# Patient Record
Sex: Male | Born: 1950 | Race: White | Hispanic: No | Marital: Married | Smoking: Never smoker
Health system: Southern US, Community
[De-identification: ages and names within clinical notes are randomized; demographics above are authoritative.]

## PROBLEM LIST (undated history)

## (undated) DIAGNOSIS — E669 Obesity, unspecified: Secondary | ICD-10-CM

## (undated) DIAGNOSIS — J189 Pneumonia, unspecified organism: Secondary | ICD-10-CM

## (undated) DIAGNOSIS — R51 Headache: Secondary | ICD-10-CM

## (undated) DIAGNOSIS — I10 Essential (primary) hypertension: Secondary | ICD-10-CM

## (undated) DIAGNOSIS — Z9989 Dependence on other enabling machines and devices: Secondary | ICD-10-CM

## (undated) DIAGNOSIS — N200 Calculus of kidney: Secondary | ICD-10-CM

## (undated) DIAGNOSIS — R519 Headache, unspecified: Secondary | ICD-10-CM

## (undated) DIAGNOSIS — K759 Inflammatory liver disease, unspecified: Secondary | ICD-10-CM

## (undated) DIAGNOSIS — F329 Major depressive disorder, single episode, unspecified: Secondary | ICD-10-CM

## (undated) DIAGNOSIS — F32A Depression, unspecified: Secondary | ICD-10-CM

## (undated) DIAGNOSIS — G2581 Restless legs syndrome: Secondary | ICD-10-CM

## (undated) DIAGNOSIS — G4733 Obstructive sleep apnea (adult) (pediatric): Secondary | ICD-10-CM

## (undated) HISTORY — DX: Obesity, unspecified: E66.9

## (undated) HISTORY — DX: Major depressive disorder, single episode, unspecified: F32.9

## (undated) HISTORY — DX: Calculus of kidney: N20.0

## (undated) HISTORY — DX: Essential (primary) hypertension: I10

## (undated) HISTORY — DX: Dependence on other enabling machines and devices: Z99.89

## (undated) HISTORY — DX: Restless legs syndrome: G25.81

## (undated) HISTORY — PX: COLONOSCOPY W/ BIOPSIES AND POLYPECTOMY: SHX1376

## (undated) HISTORY — DX: Obstructive sleep apnea (adult) (pediatric): G47.33

## (undated) HISTORY — DX: Depression, unspecified: F32.A

---

## 1999-11-23 ENCOUNTER — Encounter: Admission: RE | Admit: 1999-11-23 | Discharge: 1999-11-23 | Payer: Self-pay | Admitting: Family Medicine

## 1999-11-23 ENCOUNTER — Encounter: Payer: Self-pay | Admitting: Family Medicine

## 2002-08-08 ENCOUNTER — Ambulatory Visit (HOSPITAL_BASED_OUTPATIENT_CLINIC_OR_DEPARTMENT_OTHER): Admission: RE | Admit: 2002-08-08 | Discharge: 2002-08-08 | Payer: Self-pay | Admitting: Family Medicine

## 2005-08-13 ENCOUNTER — Ambulatory Visit: Payer: Self-pay | Admitting: Internal Medicine

## 2005-11-04 ENCOUNTER — Encounter: Payer: Self-pay | Admitting: Internal Medicine

## 2005-11-04 ENCOUNTER — Inpatient Hospital Stay (HOSPITAL_COMMUNITY): Admission: RE | Admit: 2005-11-04 | Discharge: 2005-11-08 | Payer: Self-pay | Admitting: Internal Medicine

## 2005-11-19 ENCOUNTER — Ambulatory Visit: Payer: Self-pay | Admitting: Internal Medicine

## 2006-01-31 ENCOUNTER — Ambulatory Visit: Payer: Self-pay | Admitting: Internal Medicine

## 2006-03-18 ENCOUNTER — Ambulatory Visit: Payer: Self-pay | Admitting: Internal Medicine

## 2006-08-08 ENCOUNTER — Ambulatory Visit: Payer: Self-pay | Admitting: Internal Medicine

## 2006-09-05 ENCOUNTER — Ambulatory Visit: Payer: Self-pay | Admitting: Internal Medicine

## 2006-11-15 ENCOUNTER — Ambulatory Visit: Payer: Self-pay | Admitting: Internal Medicine

## 2007-08-21 DIAGNOSIS — J302 Other seasonal allergic rhinitis: Secondary | ICD-10-CM | POA: Insufficient documentation

## 2007-08-21 DIAGNOSIS — J4520 Mild intermittent asthma, uncomplicated: Secondary | ICD-10-CM | POA: Insufficient documentation

## 2007-08-21 DIAGNOSIS — J3089 Other allergic rhinitis: Secondary | ICD-10-CM

## 2007-08-23 ENCOUNTER — Ambulatory Visit: Payer: Self-pay | Admitting: Internal Medicine

## 2007-10-30 ENCOUNTER — Telehealth (INDEPENDENT_AMBULATORY_CARE_PROVIDER_SITE_OTHER): Payer: Self-pay | Admitting: *Deleted

## 2008-08-12 ENCOUNTER — Ambulatory Visit: Payer: Self-pay | Admitting: Internal Medicine

## 2009-08-12 ENCOUNTER — Ambulatory Visit: Payer: Self-pay | Admitting: Internal Medicine

## 2009-09-01 ENCOUNTER — Encounter: Payer: Self-pay | Admitting: Internal Medicine

## 2010-08-27 ENCOUNTER — Ambulatory Visit: Payer: Self-pay | Admitting: Internal Medicine

## 2010-12-01 NOTE — Assessment & Plan Note (Signed)
Summary: per pt call/pt due/mhh   Primary Provider/Referring Provider:  Holley Bouche, [facility]  CC:  Follow up visit-asthma and allergies..  History of Present Illness:  08/19/2008- 1 yr f/u. Feels well. Occ rhinorhea. Hasn't needed medrol taper in last year but asks to keep avail. Steroid talk done.  2009-08-19- Asthma, rhinitis, OSA Used medrol dospak only once, with a deep chest cold this Spring. He continues fexofenadine and Advair 100 regularly, rarely needing rescue inhaler. No major other health issues in past year. denies cough, wheeze, nasal congestion, sneezing. Dx'd with obstrucdtive sleep apnea and on CPAP, unknown pressure.  August 27, 2010- Asthma, rhinitis, OSA Nurse-CC:Follow up visit-asthma and allergies. Needs flu shot and refills, otherwise set  CPAP 10 SMS uses all night every night and sleeps better with it.  He continues Advair and Accolate. He hasn't needed Proair at all this year. He hasn't needed to use the medrol dospak this year.      Asthma History    Initial Asthma Severity Rating:    Age range: 12+ years    Symptoms: 0-2 days/week    Nighttime Awakenings: 0-2/month    Interferes w/ normal activity: no limitations    SABA use (not for EIB): 0-2 days/week    Asthma Severity Assessment: Intermittent   Preventive Screening-Counseling & Management  Alcohol-Tobacco     Smoking Status: never     Passive Smoke Exposure: yes  Current Medications (verified): 1)  Accolate 20 Mg  Tabs (Zafirlukast) .... Use By Mouth Bid 2)  Fexofenadine Hcl 60 Mg  Tabs (Fexofenadine Hcl) .... One By Mouth Once Daily 3)  Advair Diskus 100-50 Mcg/dose  Misc (Fluticasone-Salmeterol) .... One Puff Bid 4)  Clonazepam 0.5 Mg  Tabs (Clonazepam) .... One Qhs 5)  Amitriptyline Hcl 10 Mg  Tabs (Amitriptyline Hcl) .... Two At Bedtime 6)  Proair Hfa 108 (90 Base) Mcg/act Aers (Albuterol Sulfate) .... 2 Puffs Four Times A Day As Needed Rescue 7)  Methylprednisolone 4 Mg Tabs  (Methylprednisolone) .Marland Kitchen.. 1 Tab Four Times Daily X 2 Days, 3 Times Daily X 2 Days, 2 Times Daily X 2 Days, 1 Time Daily X 2 Days 8)  Cpap [employer] 9)  Zoloft 50 Mg  Tabs (Sertraline Hcl) .... One By Mouth Qd 10)  Zomig 5 Mg  Tabs (Zolmitriptan) .... Prn 11)  Valtrex 1 Gm  Tabs (Valacyclovir Hcl) .... Prn 12)  Adult Aspirin Ec Low Strength 81 Mg  Tbec (Aspirin) .... One Qd 13)  Multivitamins   Tabs (Multiple Vitamin) .... One Qd 14)  Clarithromycin 500 Mg  Tabs (Clarithromycin) .Marland Kitchen.. 1 Twice Daily After Meals 15)  Androgel Pump 1 % Gel (Testosterone) .... 4 Pumps Once Daily 16)  Hydrochlorothiazide 25 Mg Tabs (Hydrochlorothiazide) .... Take 1 By Mouth Once Daily 17)  Diovan 160 Mg Tabs (Valsartan) .... 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  Patient never smoked.  married  Risk Factors: Smoking Status: never (08/27/2010) Passive Smoke Exposure: yes (08/27/2010)  Review of Systems      See HPI  The patient denies shortness of breath with activity, shortness of breath at rest, productive  cough, non-productive cough, coughing up blood, chest pain, irregular heartbeats, acid heartburn, indigestion, loss of appetite, weight change, abdominal pain, difficulty swallowing, sore throat, tooth/dental problems, headaches, nasal congestion/difficulty breathing through nose, sneezing, itching, rash, and fever.    Vital Signs:  Patient profile:   60 year old male Weight:      218 pounds O2 Sat:      97 % on Room air Pulse rate:   98 / minute BP sitting:    128 / 86  (right arm) Cuff size:   regular  Vitals Entered By: Reynaldo Minium CMA (August 27, 2010 2:16 PM)  O2 Flow:  Room air CC: Follow up visit-asthma and allergies.   Physical Exam  Additional Exam:  General: A/Ox3; pleasant and cooperative, NAD, note weight up SKIN: no rash, lesions NODES: no lymphadenopathy HEENT: [place]/AT, EOM- WNL, Conjuctivae- clear, PERRLA, TM-WNL, Nose- clear, Throat- clear and wnl, Mallampati II NECK: Supple w/ fair ROM, JVD- none, normal carotid impulses w/o bruits Thyroid-  CHEST: Clear to P&A HEART: RRR, no m/g/r heard ABDOMEN: Has put on some weight. UYQ:IHKV, nl pulses, no edema  NEURO: Grossly intact to observation      Impression & Recommendations:  Problem # 1:  ASTHMA (ICD-493.90) Excellent control. I am giving him permission to try backing off Accolate as tolerated, but will refill his meds and give flu shot.  Problem # 2:  ALLERGIC RHINITIS (ICD-477.9)  He will skip fexofenadine when not needed.  His updated medication list for this problem includes:    Fexofenadine Hcl 60 Mg Tabs (Fexofenadine hcl) ..... One by mouth once daily  Medications Added to Medication List This Visit: 1)  Cpap 10 Sms   Other Orders: Est. Patient Level III (42595)  Patient Instructions: 1)  Please schedule a follow-up appointment in 1 year. 2)  Flu vax 3)  Meds refilled 4)  It would be fine to explore whether you still need Acolate and fexofenadine. They are fine to contnue if they help.  Prescriptions: METHYLPREDNISOLONE 4 MG TABS (METHYLPREDNISOLONE) 1 tab four times daily x 2 days, 3 times daily x 2 days, 2 times daily x 2 days, 1 time daily x 2 days  #20 x 1   Entered and Authorized by:   Waymon Budge MD   Signed by:   Waymon Budge MD on 08/27/2010   Method used:   Electronically to        [facility] [address] [phone]* (retail)       [address]       [place], [place]  [address]       Ph: [phone] or [phone]       Fax:  [phone]   RxID:   [id number] PROAIR HFA 108 (90 BASE) MCG/ACT AERS (ALBUTEROL SULFATE) 2 puffs four times a day as needed rescue  #1 x prn   Entered and Authorized by:   Waymon Budge MD   Signed by:   Waymon Budge MD on 08/27/2010   Method used:   Electronically to        [facility] [address] [phone]* (retail)       [address]       [place], [place]  [address]       Ph: [phone] or [phone]       Fax: [phone]   RxID:   [id number] ADVAIR DISKUS 100-50 MCG/DOSE  MISC (FLUTICASONE-SALMETEROL) ONE PUFF BID  #1 x prn   Entered  and Authorized by:   Waymon Budge MD   Signed by:   Waymon Budge MD on 08/27/2010   Method used:   [facility]ronically to        [facility] [address] [phone]* (retail)       [address]       [place], [place]  [address]       Ph: [phone] or [phone]       Fax: [phone]   RxID:   [id number] ACCOLATE 20 MG  TABS (ZAFIRLUKAST) USE by mouth BID  #60 Tablet x prn   Entered and Authorized by:   Waymon Budge MD   Signed by:   Waymon Budge MD on 08/27/2010   Method used:   Electronically to        [facility] [address] [phone]* (retail)       [address]       [place], [place]  [address]       Ph: [phone] or [phone]       Fax: [phone]   RxID:   [id number]

## 2010-12-14 ENCOUNTER — Other Ambulatory Visit: Payer: Self-pay | Admitting: Family Medicine

## 2010-12-14 ENCOUNTER — Ambulatory Visit
Admission: RE | Admit: 2010-12-14 | Discharge: 2010-12-14 | Disposition: A | Discharge status: Home / Self Care | Payer: BC Managed Care – PPO | Source: Ambulatory Visit | Attending: Family Medicine | Admitting: Family Medicine

## 2010-12-14 DIAGNOSIS — R52 Pain, unspecified: Secondary | ICD-10-CM

## 2011-03-16 NOTE — Assessment & Plan Note (Signed)
[name]                             PULMONARY OFFICE NOTE   [name] Brown, [name] Brown                      MRN:          [id number]  DATE:08/23/2007                            DOB:          1950/11/15    PROBLEM:  1. Allergic rhinitis.  2. Asthma.   HISTORY:  He comes for 1-year followup with no recent asthma problems,  needing routine medication refills.  Has had flu shot.  No complaints.  He is quite satisfied with care.  He still wants to keep a Medrol  Dosepak available, although he has not used it, and we discussed this  carefully.   MEDICATIONS:  1. Accolate 20 mg b.i.d.  2. Fexofenadine 60 mg.  3. Advair Diskus 100/50 b.i.d.  4. Zoloft 50 mg.  5. Clonazepam 0.5 mg nightly.  6. Amitriptyline 10 mg x2 nightly.  7. Albuterol inhaler p.r.n.  8. Medrol 4 mg Dosepak.  9. Zomig.  10.Valtrex p.r.n.  11.Aspirin 81 mg.   NO MEDICATION ALLERGY.   OBJECTIVE:  Weight 215 pounds.  BP 138/88.  Pulse 78.  Room air  saturation 98%.  LUNG FIELDS:  Are very clear.  NASAL MUCOSA:  Looks normal.  I do hear a nose whistle, but he does not  seem that congested.  HEART:  Sounds are normal.   IMPRESSION:  Asthma and rhinitis adequately controlled.   PLAN:  We refilled his medications with discussion.  Steroid talk again  done.  Schedule return in 1 year, earlier p.r.n.    Clinton D. Maple Hudson, MD, [facility], FACP  Electronically Signed   CDY/MedQ  DD: 08/23/2007  DT: 08/24/2007  Job #: [id number]   cc:   Holley Bouche, M.D.

## 2011-03-19 NOTE — Assessment & Plan Note (Signed)
[facility]                               PULMONARY OFFICE NOTE   [name] Brown, [name] Brown                      MRN:          [id number]  DATE:08/08/2006                            DOB:          1950/12/09    PROBLEM:  1. Allergic rhinitis.  2. Asthma.   HISTORY:  A 52-month followup.  He denies any complaints.  Says his breathing  is not waking him and he never wheezes.  Says had a good physical exam  report last week.  He notices a little shortness of breath if he first  starts walking fast, but warms up and that passes quickly.  No sense of  significant nasal congestion, postnasal drip, or heartburn.   MEDICATIONS:  1. Accolate 20 mg b.i.d.  2. Fexofenadine 60 mg.  3. Advair Diskus 100/50.  4. Zoloft 50 mg.  5. Clonazepam 0.5 mg at h.s.  6. Amitriptyline 10 mg x2 at h.s.  7. Albuterol rescue inhaler p.r.n.  8. He keeps a 4 mg 21-tab Medrol Dosepak available, used maybe once a year      while traveling.  9. Zomig 5 mg p.r.n.  10.Valtrex 1 gram p.r.n.  11.Aspirin 81 mg daily.  12.Multivitamin.   No medication allergy.   OBJECTIVE:  Weight 208 pounds.  BP 142/90.  Pulse regular 106.  Room air  saturation 98%.  I thought once or twice I heard some inspiratory wheeze in the upper chest,  really more during his conversation, but I could not duplicate it during  auscultation and otherwise he sounded clear.  Breathing seemed unlabored.  There was no stridor, no neck vein distention.  Heart sounds were regular without murmur or gallop.   IMPRESSION:  Stable with possibly minimal asthma and allergic rhinitis  currently.   PLAN:  Scheduled pulmonary function test, flu vaccine.  I refilled his  Medrol Dosepak to keep available with steroid discussion done.  Schedule  return 1 year, earlier p.r.n.       Clinton D. Maple Hudson, MD, [facility], FACP      CDY/MedQ  DD:  08/10/2006  DT:  08/11/2006  Job #:  [id number]   cc:   Holley Bouche, M.D.

## 2011-03-19 NOTE — H&P (Signed)
[name] Brown, [name] Brown               ACCOUNT NO.:  [id number]   MEDICAL RECORD NO.:  [id number]          PATIENT TYPE:  INP   LOCATION:  0101                         FACILITY:  [facility]   PHYSICIAN:  Melissa L. Ladona Ridgel, MD  DATE OF BIRTH:  01/20/1951   DATE OF ADMISSION:  11/04/2005  DATE OF DISCHARGE:                                HISTORY & PHYSICAL   CHIEF COMPLAINT:  Shortness of breath.   PRIMARY CARE PHYSICIAN:  Holley Bouche, M.D.   HISTORY OF PRESENT ILLNESS:  The patient is a 60 year old white male, who  states that approximately two weeks ago, he developed the onset of cold-like  symptoms.  He stated at that time he felt that his chest felt tight, and he  was short of breath, so he started on a Solu-Medrol Dosepak.  The patient  completed that, but continued to feel poorly with worsening shortness of  breath.  On the night of admission, the patient became acutely short of  breath with chest tightness, achiness, as well as fever and chills.  He used  his rescue inhaler, which did not help.  He came to the emergency room and  received nebulizer treatments, which also helped only partially.   REVIEW OF SYSTEMS:  Positive for fever and chills.  No nausea or vomiting.  Appetite has been fine.  No dysuria.  He has had a recent trip to [place].   PAST MEDICAL HISTORY:  1.  Asthma.  2.  Migraines.   PAST SURGICAL HISTORY:  None.   SOCIAL HISTORY:  He denies tobacco.  He has moderate alcohol use.  He  distributes power equipment for a living.   FAMILY HISTORY:  Mom is deceased secondary to lung cancer.  Dad is living  with coronary artery disease and valve bypass.   ALLERGIES:  GUAIFENESIN, WHICH MAKES HIM NAUSEATED.   MEDICATIONS:  1.  Advair 100/50 b.i.d.  2.  Accolate 20 mg b.i.d.  3.  Allegra 60 mg daily.  4.  Zoloft 50 mg daily.  5.  Amitriptyline 10 mg 2 tablets at bedtime.  6.  Clonazepam 0.5 mg q.h.s.  7.  Uses albuterol MDI p.r.n.  8.  Zomig 5 mg x1 p.r.n. for  migraines.  9.  Aspirin 81 mg.  10. Multivitamin.   PHYSICAL EXAMINATION:  VITAL SIGNS:  Temperature 102, blood pressure 150/80,  pulse 112, respirations 20, saturation 93-96% on 2 L.  GENERAL:  He is in no acute distress, but he is diaphoretic.  HEENT:  Normocephalic/atraumatic.  Pupils are equal, round and reactive to  light.  Extraocular muscles are intact.  Mucous membranes are moist.  NECK:  Supple.  There is no JVD, lymph nodes, or carotid bruits.  CHEST:  Coarse rhonchi bilaterally with no wheeze.  CARDIOVASCULAR:  Tachycardic with positive S1 and S2.  No S3 or S4.  ABDOMEN:  Soft, nontender, nondistended, positive bowel sounds.  EXTREMITIES:  No cyanosis, clubbing or edema.  NEUROLOGIC:  Nonfocal.   LABORATORY DATA:  X-ray changes consistent with bronchiectasis and  bronchitis.  The bronchiectasis is in the right lower lobe.  The pH of ABG  was 7.44, pCO2 32, pO2 57, bicarbonate 12.7.  Sodium 134, potassium 4.2,  chloride 105, CO2 24, BUN 18, creatinine 0.9, glucose 124.   ASSESSMENT/PLAN:  This is a 60 year old white male with a history of asthma,  well-controlled.  His last asthma attack was many years ago.  In the last  two weeks, he has had an exacerbation with worsening symptoms in the last 24  hours.   1.  Pulmonary.  With the patient's fever and evidence for bronchiectasis on      his chest x-ray, we will consider chest CT to assess possible PE versus      possible pneumonia.  We will consider consulting Dr. Maple Hudson in the a.m.      at the patient's and family's request.  We will start him on a PPI,      nebulizers, flutter valve.  I cannot give him guaifenesin, as this is      one of his allergies.  2.  Cardiovascular.  He is tachycardic likely secondary to nebs.  Also need      to consider PE.  He essentially has no risk factors other than recent      travel to [place].  3.  Gastrointestinal.  As stated, will start on PPI.  4.  Genitourinary.  He has no dysuria, but  we will check a UA C&S because of      his fever.  5.  Endocrine.  He has no current issues.  We will follow his blood sugars      on his BMT and use sliding scale insulin if needed while on Solu-Medrol.  6.  Neurology.  Migraines are maintained with amitriptyline and Zoloft,      which I will continue, and he can have p.r.n. Zomig.  7.  Infectious disease.  I have started him on Zosyn and will add      azithromycin because of the recent travel.  We will also obtain blood      cultures.      Melissa L. Ladona Ridgel, MD  Electronically Signed     MLT/MEDQ  D:  11/04/2005  T:  11/04/2005  Job:  [id number]   cc:   Holley Bouche, M.D.  Fax: [phone]

## 2011-03-19 NOTE — Discharge Summary (Signed)
[name] Brown, [name] Brown               ACCOUNT NO.:  [id number]   MEDICAL RECORD NO.:  [id number]          PATIENT TYPE:  INP   LOCATION:  4729                         FACILITY:  [facility]   PHYSICIAN:  Hollice Espy, M.D.DATE OF BIRTH:  20-May-1951   DATE OF ADMISSION:  11/04/2005  DATE OF DISCHARGE:  11/08/2005                                 DISCHARGE SUMMARY   PULMONOLOGIST:  Joni Fears D. Maple Hudson, M.D.   PRIMARY CARE PHYSICIAN:  Holley Bouche, M.D.   DISCHARGE DIAGNOSES:  1.  Acute asthma exacerbation.  2.  Underlying bronchitis versus questionable lingular pneumonia.  3.  Hypoxia secondary to number 1 and 2.  4.  History of anxiety.  5.  Steroid induced hyperglycemia.  6.  History of migraines.   DISCHARGE MEDICATIONS:  The patient will continue all of his original home  medications.  These are:  1.  Advair 100/50 1 puff b.i.d.  2.  Accolate 20 p.o. b.i.d.  3.  Allegra 60 p.o. daily.  4.  Clonazepam 0.5 p.o. daily.  5.  Amitriptyline 10 p.o. daily.  6.  Zoloft 50 p.o. daily.  7.  Zomig p.r.n.  8.  Aspirin 81 daily.  9.  Multivitamin daily.   He will change his albuterol from p.r.n. to 2 puffs 4 x day for the next  week.  Continuous then p.r.n.  He will also be discharged on Atrovent  inhaler 2 puffs 4 x day for the next 1 week and then p.r.n. Avalox 400 mg  p.o. daily for 3 more days and prednisone taper starting at 50 mg p.o.  b.i.d. to decrease by 10 mg p.o. b.i.d. for 5 more days until complete.   The patient follow-up appointments will be with Dr. [name] in one week  on November 15, 2004.  The patient is advised to call and make an  appointment.   Discharge diet will be a low sodium diet.   ACTIVITIES:  The patient is advised to rest for the next week with no heavy  exertion.  In addition he has advised me that his wife smokes at home  although she does smoke outside.  I have advised for the next week while he  is recovering, for her not to smoke at all as  particles in her clothing may  set off his asthma.   HISTORY OF PRESENT ILLNESS:  The patient is a very pleasant 60 year old  white male with past medical history of asthma who 2 weeks prior to coming  in started having some cold symptoms with mild shortness of breath.  He was  started on a Medrol Dose pack but he continued to have worsening symptoms.  Finally on day of admission he could not take it any more and came in short  of breath with tightness in his chest.  The patient was evaluated.  He was  found to have some bronchiectatic changes, right lower lobe bronchiectasis  and some haziness seen on chest x-ray.  More concerning was a white count of  34.  The patient was noted to be saturating at 93 to 96% on  2 liters.  He  was given a dose of Solu-Medrol nebulizers and antibiotics in the emergency  room and started to feel a little bit better.  But still was feeling quite  ill.  It was felt given especially with his high white count that he needed  to be admitted for further evaluation and treatment.  The patient was  admitted.  He was transferred over to [facility] from [facility]  secondary to bed availability.  At that time he was continued on nebulizers,  breathing treatments, and antibiotics.  Initially he had been on Zosyn for 1  dose, but this was prior to his white count being known.  This was changed  over to Rocephin and Zithromax.  Following repeat of his white count on  hospital day 2, he had gone from 34.9 to 34.4.  Given this very small  decrease in his dose, I have put him back on Zosyn and continued the  Zithromax.  The patient over the next several days continued to show signs  of improvement.  He was on IV steroids as well as insulin sliding scale to  control his steroid-induced hyperglycemia.  His breathing continued to  improve.  His initial ABG on admission on room air showed PO2 in the 50's  with follow-up chest x-ray done on 11/06/05 showing evidence of  right  questionable right lingular infiltrate. The patient himself continued to  feel better and better.  He was able to be weaned off on oxygen on November 07, 2005.  An ABG done on November 07, 2005 showed a pO2 of 78 on room air,  much closer to patient's baseline.  The patient states that he still feels  some mild shortness of breath  with heavy exertion and he still has very  trace wheezing.  He states he is feeling much closer to his baseline.  At  this point he is saturating 98% on 2 liters.   PLAN:  The plan will be for patient to be discharged to home on November 08, 2005.  Please note also on admission a CT scan was done to rule out  pulmonary embolus that was found to be negative.  The patient will be  discharged home with the above medications.  He will continue on prednisone  taper.  I feel at this time he is no longer hypoxic and does not require  home O2.  We will try to treat him with a course of inhalers.  I do not  think we need a nebulizer at this point at home either.  Will put him on  p.o. steroids in the morning and continue him on p.o. antibiotics for 3 more  days for a total of 7 day course therapy.  In addition of course the patient  is advised not to do any heavy exertion.  He will follow up with his  pulmonologist in one week's time.   The patient's overall disposition has improved.  His activity again will be  quite limited.  He is being discharged to home.      Hollice Espy, M.D.  Electronically Signed     SKK/MEDQ  D:  11/07/2005  T:  11/08/2005  Job:  [id number]   cc:   Holley Bouche, M.D.  Fax: [phone]   Clinton D. Maple Hudson, M.D.  [facility] Dept  [address]  [place]  [place] [address]

## 2011-08-23 ENCOUNTER — Ambulatory Visit (INDEPENDENT_AMBULATORY_CARE_PROVIDER_SITE_OTHER): Payer: BC Managed Care – PPO | Admitting: Internal Medicine

## 2011-08-23 ENCOUNTER — Encounter: Payer: Self-pay | Admitting: Internal Medicine

## 2011-08-23 VITALS — BP 142/84 | HR 81 | Ht 70.0 in | Wt 221.4 lb

## 2011-08-23 DIAGNOSIS — J45909 Unspecified asthma, uncomplicated: Secondary | ICD-10-CM

## 2011-08-23 DIAGNOSIS — Z23 Encounter for immunization: Secondary | ICD-10-CM

## 2011-08-23 DIAGNOSIS — G4733 Obstructive sleep apnea (adult) (pediatric): Secondary | ICD-10-CM

## 2011-08-23 DIAGNOSIS — J309 Allergic rhinitis, unspecified: Secondary | ICD-10-CM

## 2011-08-23 MED ORDER — METHYLPREDNISOLONE 4 MG PO TABS: ORAL_TABLET | ORAL | Status: DC

## 2011-08-23 MED ORDER — ZAFIRLUKAST 20 MG PO TABS: 20.0000 mg | ORAL_TABLET | Freq: Two times a day (BID) | ORAL | Status: DC

## 2011-08-23 MED ORDER — ALBUTEROL SULFATE HFA 108 (90 BASE) MCG/ACT IN AERS: 2.0000 | INHALATION_SPRAY | RESPIRATORY_TRACT | Status: DC | PRN

## 2011-08-23 MED ORDER — FLUTICASONE-SALMETEROL 100-50 MCG/DOSE IN AEPB: 1.0000 | INHALATION_SPRAY | Freq: Two times a day (BID) | RESPIRATORY_TRACT | Status: DC

## 2011-08-23 NOTE — Progress Notes (Signed)
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 He has not had significant problems with asthma since last here. She likes to keep a Medrol taper available but has not used it. He continues regular use of Accolate and Advair without need for his rescue inhaler. He denies significant nasal congestion sneezing stuffiness or drainage. He continues to use CPAP all night every night at 10 CWP but he is interested in alternatives because of his travel. We discussed Provent.   ROS-see HPI Constitutional:   No-   weight loss, night sweats, fevers, chills, fatigue, lassitude. HEENT:   No-  headaches, difficulty swallowing, tooth/dental problems, sore throat,       No-  sneezing, itching, ear ache, +nasal congestion, post nasal drip,  CV:  No-   chest pain, orthopnea, PND, swelling in lower extremities, anasarca, dizziness, palpitations Resp: No-   shortness of breath with exertion or at rest.              No-   productive cough,  No non-productive cough,  No- coughing up of blood.              No-   change in color of mucus.  No- wheezing.   Skin: No-   rash or lesions. GI:  No-   heartburn, indigestion, abdominal pain, nausea, vomiting, diarrhea,                 change in bowel habits, loss of appetite GU: No-   dysuria, change in color of urine, no urgency or frequency.  No- flank pain. MS:  No-   joint pain or swelling.  No- decreased range of motion.  No- back pain. Neuro-     nothing unusual Psych:  No- change in mood or affect. No depression or anxiety.  No memory loss.  OBJ General- Alert, Oriented, Affect-appropriate, Distress- none acute. Looks well Skin- rash-none, lesions- none, excoriation- none Lymphadenopathy- none Head- atraumatic            Eyes- Gross vision intact, PERRLA, conjunctivae clear secretions            Ears- Hearing,  canals-normal            Nose- Clear, no-Septal dev, mucus, polyps, erosion, perforation             Throat- Mallampati II-III , mucosa clear , drainage- none, tonsils- atrophic Neck- flexible , trachea midline, no stridor , thyroid nl, carotid no bruit Chest - symmetrical excursion , unlabored           Heart/CV- RRR , no murmur , no gallop  , no rub, nl s1 s2                           - JVD- none , edema- none, stasis changes- none, varices- none           Lung- clear to P&A, wheeze- none, cough- none , dullness-none, rub- none           Chest wall-  Abd- tender-no, distended-no, bowel sounds-present, HSM- no Br/ Gen/ Rectal- Not done, not indicated Extrem- cyanosis- none, clubbing, none, atrophy- none, strength- nl Neuro- grossly intact to observation

## 2011-08-23 NOTE — Patient Instructions (Signed)
Meds refilled- please call as needed  Flu vax

## 2011-08-25 DIAGNOSIS — G4733 Obstructive sleep apnea (adult) (pediatric): Secondary | ICD-10-CM | POA: Insufficient documentation

## 2011-08-25 NOTE — Assessment & Plan Note (Signed)
Good compliance and control with CPAP With travel, he is curious about trying the Provent nasal valves and and we are giving him a 10 day trial pack prescription.

## 2011-08-25 NOTE — Assessment & Plan Note (Signed)
Good subjective control now without need for additional meds. We discussed options in the future.

## 2011-08-25 NOTE — Assessment & Plan Note (Signed)
Mild intermittent asthma. Well controlled with Accolate plus Advair no change indicated.

## 2012-08-21 ENCOUNTER — Ambulatory Visit (INDEPENDENT_AMBULATORY_CARE_PROVIDER_SITE_OTHER): Payer: BC Managed Care – PPO | Admitting: Internal Medicine

## 2012-08-21 ENCOUNTER — Encounter: Payer: Self-pay | Admitting: Internal Medicine

## 2012-08-21 VITALS — BP 148/82 | HR 92 | Ht 70.0 in | Wt 233.0 lb

## 2012-08-21 DIAGNOSIS — Z23 Encounter for immunization: Secondary | ICD-10-CM

## 2012-08-21 DIAGNOSIS — J45909 Unspecified asthma, uncomplicated: Secondary | ICD-10-CM

## 2012-08-21 DIAGNOSIS — J309 Allergic rhinitis, unspecified: Secondary | ICD-10-CM

## 2012-08-21 DIAGNOSIS — G4733 Obstructive sleep apnea (adult) (pediatric): Secondary | ICD-10-CM

## 2012-08-21 MED ORDER — METHYLPREDNISOLONE 4 MG PO TABS: ORAL_TABLET | ORAL | Status: DC

## 2012-08-21 MED ORDER — FLUTICASONE-SALMETEROL 100-50 MCG/DOSE IN AEPB: 1.0000 | INHALATION_SPRAY | Freq: Two times a day (BID) | RESPIRATORY_TRACT | Status: DC

## 2012-08-21 MED ORDER — ZAFIRLUKAST 20 MG PO TABS: 20.0000 mg | ORAL_TABLET | Freq: Two times a day (BID) | ORAL | Status: DC

## 2012-08-21 MED ORDER — ALBUTEROL SULFATE HFA 108 (90 BASE) MCG/ACT IN AERS: 2.0000 | INHALATION_SPRAY | RESPIRATORY_TRACT | Status: DC | PRN

## 2012-08-21 NOTE — Progress Notes (Signed)
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 He has not had significant problems with asthma since last here. He likes to keep a Medrol taper available but has not used it. He continues regular use of Accolate and Advair without need for his rescue inhaler. He denies significant nasal congestion sneezing stuffiness or drainage. He continues to use CPAP all night every night at 10 CWP but he is interested in alternatives because of his travel. We discussed Provent.   10/ 21/ 13- 60 yoM never smoker followed for asthma, allergic rhinitis, complicated by OSA/CPAP Pt states no changes since last visit- CPAP 10/ [facility]. He likes CPAP, using it every night and taking with him when he travels. Asthma has been much better controlled in recent years. Because he travels a lot, he likes to keep a prescription for Medrol but he did not use of last year. He has dropped off of Accolate but continues Advair 100. Rare use of his albuterol rescue inhaler  ROS-see HPI Constitutional:   No-   weight loss, night sweats, fevers, chills, fatigue, lassitude. HEENT:   No-  headaches, difficulty swallowing, tooth/dental problems, sore throat,       No-  sneezing, itching, ear ache, +nasal congestion, post nasal drip,  CV:  No-   chest pain, orthopnea, PND, swelling in lower extremities, anasarca, dizziness, palpitations Resp: No-   shortness of breath with exertion or at rest.              No-   productive cough,  No non-productive cough,  No- coughing up of blood.              No-   change in color of mucus.  No- wheezing.   Skin: No-   rash or lesions. GI:  No-   heartburn, indigestion, abdominal pain, nausea, vomiting,  GU:  MS:  No-   joint pain or swelling.   Neuro-     nothing unusual Psych:  No- change in mood or affect. No depression or anxiety.  No  memory loss.  OBJ General- Alert, Oriented, Affect-appropriate, Distress- none acute. Looks well Skin- rash-none, lesions- none, excoriation- none Lymphadenopathy- none Head- atraumatic            Eyes- Gross vision intact, PERRLA, conjunctivae clear secretions            Ears- Hearing, canals-normal            Nose- Clear, no-Septal dev, mucus, polyps, erosion, perforation             Throat- Mallampati II-III , mucosa clear , drainage- none, tonsils- atrophic Neck- flexible , trachea midline, no stridor , thyroid nl, carotid no bruit Chest - symmetrical excursion , unlabored           Heart/CV- RRR , no murmur , no gallop  , no rub, nl s1 s2                           - JVD- none , edema- none, stasis changes- none, varices- none           Lung- clear to P&A, wheeze- none, cough- none , dullness-none, rub- none           Chest wall-  Abd-  Br/ Gen/ Rectal- Not done, not indicated Extrem- cyanosis-  none, clubbing, none, atrophy- none, strength- nl Neuro- grossly intact to observation

## 2012-08-21 NOTE — Patient Instructions (Addendum)
We can continue CPAP 10/ [facility]  Refill scripts sent to [facility]  TDAP  Flu vax

## 2012-08-30 NOTE — Assessment & Plan Note (Signed)
Good control now with Advair 100 and occasional rescue inhaler. He feels more secure carrying a prescription for Medrol when he travels, but he doesn't use it.

## 2012-08-30 NOTE — Assessment & Plan Note (Signed)
DME company has changed to [facility]. He is pleased with the pressure and control. No changes are needed. I did talk to them about his weight and about regular sleep schedule.

## 2012-08-30 NOTE — Assessment & Plan Note (Signed)
Better control but in past years. We discussed antihistamines.

## 2012-10-16 ENCOUNTER — Emergency Department (HOSPITAL_COMMUNITY)
Admission: EM | Admit: 2012-10-16 | Discharge: 2012-10-16 | Disposition: A | Discharge status: Home / Self Care | Payer: No Typology Code available for payment source | Attending: Emergency Medicine | Admitting: Emergency Medicine

## 2012-10-16 ENCOUNTER — Encounter (HOSPITAL_COMMUNITY): Payer: Self-pay | Admitting: *Deleted

## 2012-10-16 ENCOUNTER — Emergency Department (HOSPITAL_COMMUNITY): Payer: No Typology Code available for payment source

## 2012-10-16 DIAGNOSIS — Y9389 Activity, other specified: Secondary | ICD-10-CM | POA: Insufficient documentation

## 2012-10-16 DIAGNOSIS — M549 Dorsalgia, unspecified: Secondary | ICD-10-CM

## 2012-10-16 DIAGNOSIS — IMO0002 Reserved for concepts with insufficient information to code with codable children: Secondary | ICD-10-CM | POA: Insufficient documentation

## 2012-10-16 DIAGNOSIS — Z79899 Other long term (current) drug therapy: Secondary | ICD-10-CM | POA: Insufficient documentation

## 2012-10-16 DIAGNOSIS — J45909 Unspecified asthma, uncomplicated: Secondary | ICD-10-CM | POA: Insufficient documentation

## 2012-10-16 DIAGNOSIS — J309 Allergic rhinitis, unspecified: Secondary | ICD-10-CM | POA: Insufficient documentation

## 2012-10-16 DIAGNOSIS — Z7982 Long term (current) use of aspirin: Secondary | ICD-10-CM | POA: Insufficient documentation

## 2012-10-16 MED ORDER — IBUPROFEN 600 MG PO TABS: 600.0000 mg | ORAL_TABLET | Freq: Three times a day (TID) | ORAL | Status: DC | PRN

## 2012-10-16 MED ORDER — IBUPROFEN 200 MG PO TABS
600.0000 mg | ORAL_TABLET | Freq: Once | ORAL | Status: AC
  Administered 2012-10-16: 600 mg via ORAL
  Filled 2012-10-16: qty 3

## 2012-10-16 MED ORDER — OXYCODONE-ACETAMINOPHEN 5-325 MG PO TABS: 1.0000 | ORAL_TABLET | ORAL | Status: DC | PRN
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
## 2013-08-20 ENCOUNTER — Ambulatory Visit: Payer: BC Managed Care – PPO | Admitting: Internal Medicine

## 2013-08-25 ENCOUNTER — Other Ambulatory Visit: Payer: Self-pay | Admitting: Cardiology

## 2013-08-26 ENCOUNTER — Other Ambulatory Visit: Payer: Self-pay | Admitting: Internal Medicine

## 2013-08-31 ENCOUNTER — Other Ambulatory Visit: Payer: Self-pay | Admitting: Internal Medicine

## 2013-09-03 ENCOUNTER — Encounter: Payer: Self-pay | Admitting: Cardiology

## 2013-09-05 ENCOUNTER — Encounter: Payer: Self-pay | Admitting: Cardiology

## 2013-09-05 ENCOUNTER — Ambulatory Visit (INDEPENDENT_AMBULATORY_CARE_PROVIDER_SITE_OTHER): Payer: BC Managed Care – PPO | Admitting: Cardiology

## 2013-09-05 VITALS — BP 151/84 | HR 80 | Ht 70.0 in | Wt 214.0 lb

## 2013-09-05 DIAGNOSIS — E669 Obesity, unspecified: Secondary | ICD-10-CM | POA: Insufficient documentation

## 2013-09-05 DIAGNOSIS — G2581 Restless legs syndrome: Secondary | ICD-10-CM | POA: Insufficient documentation

## 2013-09-05 DIAGNOSIS — I10 Essential (primary) hypertension: Secondary | ICD-10-CM

## 2013-09-05 DIAGNOSIS — G4733 Obstructive sleep apnea (adult) (pediatric): Secondary | ICD-10-CM

## 2013-09-05 NOTE — Patient Instructions (Signed)
Your physician wants you to follow-up in: 6 Months with Dr. Turner You will receive a reminder letter in the mail two months in advance. If you don't receive a letter, please call our office to schedule the follow-up appointment.  

## 2013-09-05 NOTE — Progress Notes (Signed)
[address], [address] [place], [place]  [address] Phone: [phone] Fax:  [phone]  Date:  09/05/2013   ID:  [name], Brown Jun 25, 1951, MRN [id number]  PCP:  Johny Blamer, MD  Sleep Medicine:  Armanda Magic, MD     History of Present Illness: [name] Brown is a 62 y.o. male with a history of OSA on CPAP, HTN, RLS and obesity who presents today for followup.  He is doing well.  He tolerates his CPAP device without problems.  He tolerates the nasal mask and feels the pressure is adequate.  He feels rested when he gets up in the am and has no daytime sleepiness.  He runs for exercise and he has lost some weight since I saw him last.  He takes clonazepam at bedtime and that controls his RLS.   Wt Readings from Last 3 Encounters:  09/05/13 214 lb (97.07 kg)  08/21/12 233 lb (105.688 kg)  08/23/11 221 lb 6.4 oz (100.426 kg)     Past Medical History  Diagnosis Date  . Asthma   . Allergic rhinitis   . OSA on CPAP   . Hypertension   . Obesity (BMI 30-39.9)   . Depressed   . Restless legs syndrome (RLS)   . Kidney stones     Current Outpatient Prescriptions  Medication Sig Dispense Refill  . acetaminophen (TYLENOL) 500 MG tablet Take 1,000 mg by mouth every 6 (six) hours as needed. For pain      . ADVAIR DISKUS 100-50 MCG/DOSE AEPB INHALE 1 PUFF INTO THE LUNGS EVERY 12 (TWELVE) HOURS. RINSE MOUTH  60 each  0  . amitriptyline (ELAVIL) 10 MG tablet Take 20 mg by mouth at bedtime.        Marland Kitchen amLODipine (NORVASC) 2.5 MG tablet TAKE 1 TABLET BY MOUTH DAILY  30 tablet  5  . ANDROGEL PUMP 20.25 MG/ACT (1.62%) GEL Apply 4 application topically daily at 6 (six) AM. Use as directed      . aspirin EC 81 MG tablet Take 81 mg by mouth daily.        . clonazePAM (KLONOPIN) 0.5 MG tablet Take 0.5 mg by mouth at bedtime.        . fexofenadine (ALLEGRA) 60 MG tablet Take 60 mg by mouth daily.        . Fluticasone-Salmeterol (ADVAIR) 100-50 MCG/DOSE AEPB Inhale 1 puff into the lungs  every 12 (twelve) hours. Rinse mouth      . furosemide (LASIX) 20 MG tablet Take 1 tablet by mouth daily.      Marland Kitchen ibuprofen (ADVIL,MOTRIN) 200 MG tablet Take 600 mg by mouth every 6 (six) hours as needed. For pain      . ibuprofen (ADVIL,MOTRIN) 600 MG tablet Take 1 tablet (600 mg total) by mouth every 8 (eight) hours as needed for pain.  15 tablet  0  . Multiple Vitamin (MULTIVITAMIN) tablet Take 1 tablet by mouth daily.        . sertraline (ZOLOFT) 50 MG tablet Take 50 mg by mouth daily.        . SUMAtriptan (IMITREX) 100 MG tablet       . valACYclovir (VALTREX) 1000 MG tablet Take 1,000 mg by mouth as needed.       . valsartan (DIOVAN) 160 MG tablet Take 160 mg by mouth daily.        . zafirlukast (ACCOLATE) 20 MG tablet Take 20 mg by mouth daily.  No current facility-administered medications for this visit.    Allergies:   No Known Allergies  Social History:  The patient  reports that he has never smoked. He does not have any smokeless tobacco history on file. He reports that he drinks alcohol. He reports that he does not use illicit drugs.   Family History:  The patient's family history includes Lung cancer in his mother; Stroke in his father.   ROS:  Please see the history of present illness.      All other systems reviewed and negative.   PHYSICAL EXAM: VS:  BP 151/84  Pulse 80  Ht 5\' 10"  (1.778 m)  Wt 214 lb (97.07 kg)  BMI 30.71 kg/m2 Well nourished, well developed, in no acute distress HEENT: normal Neck: no JVD Cardiac:  normal S1, S2; RRR; no murmur Lungs:  clear to auscultation bilaterally, no wheezing, rhonchi or rales Abd: soft, nontender, no hepatomegaly Ext: no edema Skin: warm and dry Neuro:  CNs 2-12 intact, no focal abnormalities noted       ASSESSMENT AND PLAN:  1. OSA on CPAP therapy - his AHI today shows an of 0.8/hr and 99% compliance in using more than 4 hours nightly. 2. HTN - his BP is elevated today because he went to the wrong office and was  hurried.  At home his BP runs 120/54mMHg.    - continue amlodipine/valsartan 3. Obesity - I encouraged him to continue his exercise regimen 4. Restless leg syndrome - controlled on clonazepam  Followup with me in 6 months  Signed, Armanda Magic, MD 09/05/2013 9:21 AM

## 2013-09-14 ENCOUNTER — Ambulatory Visit (INDEPENDENT_AMBULATORY_CARE_PROVIDER_SITE_OTHER): Payer: BC Managed Care – PPO | Admitting: Internal Medicine

## 2013-09-14 ENCOUNTER — Encounter: Payer: Self-pay | Admitting: Internal Medicine

## 2013-09-14 VITALS — BP 112/68 | HR 86 | Ht 70.0 in | Wt 216.2 lb

## 2013-09-14 DIAGNOSIS — G4733 Obstructive sleep apnea (adult) (pediatric): Secondary | ICD-10-CM

## 2013-09-14 DIAGNOSIS — J302 Other seasonal allergic rhinitis: Secondary | ICD-10-CM

## 2013-09-14 DIAGNOSIS — Z23 Encounter for immunization: Secondary | ICD-10-CM

## 2013-09-14 DIAGNOSIS — J4520 Mild intermittent asthma, uncomplicated: Secondary | ICD-10-CM

## 2013-09-14 DIAGNOSIS — J309 Allergic rhinitis, unspecified: Secondary | ICD-10-CM

## 2013-09-14 DIAGNOSIS — J45909 Unspecified asthma, uncomplicated: Secondary | ICD-10-CM

## 2013-09-14 MED ORDER — ALBUTEROL SULFATE HFA 108 (90 BASE) MCG/ACT IN AERS: 2.0000 | INHALATION_SPRAY | Freq: Four times a day (QID) | RESPIRATORY_TRACT | Status: DC | PRN

## 2013-09-14 MED ORDER — FLUTICASONE-SALMETEROL 100-50 MCG/DOSE IN AEPB: INHALATION_SPRAY | RESPIRATORY_TRACT | Status: DC

## 2013-09-14 MED ORDER — ZAFIRLUKAST 20 MG PO TABS: 20.0000 mg | ORAL_TABLET | Freq: Every day | ORAL | Status: DC

## 2013-09-14 MED ORDER — FEXOFENADINE HCL 60 MG PO TABS: 60.0000 mg | ORAL_TABLET | Freq: Every day | ORAL | Status: DC

## 2013-09-14 MED ORDER — METHYLPREDNISOLONE 4 MG PO TABS: ORAL_TABLET | ORAL | Status: DC

## 2013-09-14 NOTE — Progress Notes (Signed)
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 He has not had significant problems with asthma since last here. He likes to keep a Medrol taper available but has not used it. He continues regular use of Accolate and Advair without need for his rescue inhaler. He denies significant nasal congestion sneezing stuffiness or drainage. He continues to use CPAP all night every night at 10 CWP but he is interested in alternatives because of his travel. We discussed Provent.   10/ 21/ 13- 60 yoM never smoker followed for asthma, allergic rhinitis, complicated by OSA/CPAP Pt states no changes since last visit- CPAP 10/ [facility]. He likes CPAP, using it every night and taking with him when he travels. Asthma has been much better controlled in recent years. Because he travels a lot, he likes to keep a prescription for Medrol but he did not use of last year. He has dropped off of Accolate but continues Advair 100. Rare use of his albuterol rescue inhaler  09/14/13- 62 yoM never smoker followed for asthma, allergic rhinitis, complicated by OSA/CPAP(Dr Turner) Follows for- annual exam.  Pt has no complaints with his breathing at his time. CPAP 10/ [facility]- Dr  Desanctis Says his breathing is very good.he continues Advair and Accolate once daily. Rare need for rescue inhaler. Because he travels a lot he still wants to keep a prescription for a Medrol taper although he has not used it in a few years.  ROS-see HPI Constitutional:   No-   weight loss, night sweats, fevers, chills, fatigue, lassitude. HEENT:   No-  headaches, difficulty swallowing, tooth/dental problems, sore throat,       No-  sneezing, itching, ear ache, +nasal congestion, post nasal drip,  CV:  No-   chest pain, orthopnea, PND, swelling in lower extremities, anasarca, dizziness,  palpitations Resp: No-   shortness of breath with exertion or at rest.              No-   productive cough,  No non-productive cough,  No- coughing up of blood.              No-   change in color of mucus.  No- wheezing.   Skin: No-   rash or lesions. GI:  No-   heartburn, indigestion, abdominal pain, nausea, vomiting,  GU:  MS:  No-   joint pain or swelling.   Neuro-     nothing unusual Psych:  No- change in mood or affect. No depression or anxiety.  No memory loss.  OBJ General- Alert, Oriented, Affect-appropriate, Distress- none acute. Looks well Skin- rash-none, lesions- none, excoriation- none Lymphadenopathy- none Head- atraumatic            Eyes- Gross vision intact, PERRLA, conjunctivae clear secretions            Ears- Hearing, canals-normal            Nose- Clear, no-Septal dev, mucus, polyps, erosion, perforation             Throat- Mallampati II-III , mucosa clear , drainage- none, tonsils- atrophic Neck- flexible , trachea midline, no stridor , thyroid nl, carotid no bruit Chest - symmetrical excursion , unlabored           Heart/CV- RRR , no murmur , no gallop  , no rub, nl s1 s2                           -  JVD- none , edema- none, stasis changes- none, varices- none           Lung- clear to P&A, wheeze- none, cough- none , dullness-none, rub- none           Chest wall-  Abd-  Br/ Gen/ Rectal- Not done, not indicated Extrem- cyanosis- none, clubbing, none, atrophy- none, strength- nl Neuro- grossly intact to observation     

## 2013-09-14 NOTE — Patient Instructions (Signed)
Med refills printed  Please call as needed

## 2013-09-18 ENCOUNTER — Encounter: Payer: Self-pay | Admitting: Cardiology

## 2013-09-30 NOTE — Assessment & Plan Note (Signed)
Managed by Dr. Mayford Knife

## 2013-09-30 NOTE — Assessment & Plan Note (Signed)
Satisfactory control. 

## 2013-09-30 NOTE — Assessment & Plan Note (Signed)
He medications reviewed.he is very stable currently. Plan-refill medications with discussion. Pneumonia conjugate vaccine 13

## 2013-11-05 ENCOUNTER — Other Ambulatory Visit (INDEPENDENT_AMBULATORY_CARE_PROVIDER_SITE_OTHER): Payer: BC Managed Care – PPO

## 2013-11-05 ENCOUNTER — Telehealth: Payer: Self-pay | Admitting: Internal Medicine

## 2013-11-05 ENCOUNTER — Ambulatory Visit (INDEPENDENT_AMBULATORY_CARE_PROVIDER_SITE_OTHER)
Admission: RE | Admit: 2013-11-05 | Discharge: 2013-11-05 | Disposition: A | Discharge status: Home / Self Care | Payer: BC Managed Care – PPO | Source: Ambulatory Visit | Attending: Internal Medicine | Admitting: Internal Medicine

## 2013-11-05 DIAGNOSIS — J209 Acute bronchitis, unspecified: Secondary | ICD-10-CM

## 2013-11-05 LAB — CBC WITH DIFFERENTIAL/PLATELET
BASOS PCT: 1.1 % (ref 0.0–3.0)
Basophils Absolute: 0.1 10*3/uL (ref 0.0–0.1)
EOS ABS: 0.1 10*3/uL (ref 0.0–0.7)
Eosinophils Relative: 1.7 % (ref 0.0–5.0)
HEMATOCRIT: 42.7 % (ref 39.0–52.0)
HEMOGLOBIN: 14.6 g/dL (ref 13.0–17.0)
LYMPHS ABS: 1.3 10*3/uL (ref 0.7–4.0)
LYMPHS PCT: 17 % (ref 12.0–46.0)
MCHC: 34.2 g/dL (ref 30.0–36.0)
MCV: 95.6 fl (ref 78.0–100.0)
MONO ABS: 0.6 10*3/uL (ref 0.1–1.0)
Monocytes Relative: 7.7 % (ref 3.0–12.0)
NEUTROS ABS: 5.4 10*3/uL (ref 1.4–7.7)
Neutrophils Relative %: 72.5 % (ref 43.0–77.0)
Platelets: 306 10*3/uL (ref 150.0–400.0)
RBC: 4.46 Mil/uL (ref 4.22–5.81)
RDW: 12.4 % (ref 11.5–14.6)
WBC: 7.5 10*3/uL (ref 4.5–10.5)

## 2013-11-05 MED ORDER — PROMETHAZINE-CODEINE 6.25-10 MG/5ML PO SYRP: 5.0000 mL | ORAL_SOLUTION | Freq: Four times a day (QID) | ORAL | Status: DC | PRN

## 2013-11-05 NOTE — Telephone Encounter (Signed)
Called and spoke with pts wife and she stated that the pt has been sick for about 3 wks now.  Started mid December with flu like symptoms and he started the mucinex and a pred taper.  Pt still has the cough---wife was unsure if productive or not---no fever now but he still feels run down.  She stated about 10 yrs ago the same symptoms started like this and he ended up in the hospital with PNA.   Pt would like to be seen if possible.  Please advise. Thanks  Last ov--09/14/13 Next ov--09/16/14  No Known Allergies   Current Outpatient Prescriptions on File Prior to Visit  Medication Sig Dispense Refill  . acetaminophen (TYLENOL) 500 MG tablet Take 1,000 mg by mouth every 6 (six) hours as needed. For pain      . albuterol (PROVENTIL HFA;VENTOLIN HFA) 108 (90 BASE) MCG/ACT inhaler Inhale 2 puffs into the lungs every 6 (six) hours as needed for wheezing or shortness of breath.  1 Inhaler  prn  . amitriptyline (ELAVIL) 10 MG tablet Take 20 mg by mouth at bedtime.        Marland Kitchen. amLODipine (NORVASC) 2.5 MG tablet TAKE 1 TABLET BY MOUTH DAILY  30 tablet  5  . ANDROGEL PUMP 20.25 MG/ACT (1.62%) GEL Apply 4 application topically daily at 6 (six) AM. Use as directed      . aspirin EC 81 MG tablet Take 81 mg by mouth daily.        . clonazePAM (KLONOPIN) 0.5 MG tablet Take 0.5 mg by mouth at bedtime.        . fexofenadine (ALLEGRA) 60 MG tablet Take 1 tablet (60 mg total) by mouth daily.  30 tablet  prn  . Fluticasone-Salmeterol (ADVAIR DISKUS) 100-50 MCG/DOSE AEPB 1 puff then rinse mouth, twice daily  60 each  prn  . furosemide (LASIX) 20 MG tablet Take 1 tablet by mouth daily.      Marland Kitchen. ibuprofen (ADVIL,MOTRIN) 200 MG tablet Take 600 mg by mouth every 6 (six) hours as needed. For pain      . methylPREDNISolone (MEDROL) 4 MG tablet 4 X 2 DAYS, 3 X 2 DAYS, 2 X 2 DAYS, 1 X 2 DAYS  20 tablet  3  . Multiple Vitamin (MULTIVITAMIN) tablet Take 1 tablet by mouth daily.        . sertraline (ZOLOFT) 50 MG tablet Take 50 mg  by mouth daily.        . SUMAtriptan (IMITREX) 100 MG tablet       . valACYclovir (VALTREX) 1000 MG tablet Take 1,000 mg by mouth as needed.       . valsartan (DIOVAN) 160 MG tablet Take 160 mg by mouth daily.        . zafirlukast (ACCOLATE) 20 MG tablet Take 1 tablet (20 mg total) by mouth daily.  30 tablet  prn   No current facility-administered medications on file prior to visit.

## 2013-11-05 NOTE — Telephone Encounter (Signed)
Spoke with pt and advised of CY recommendations.  Orders placed.  Will send message to Florentina AddisonKatie to watch for results.

## 2013-11-05 NOTE — Telephone Encounter (Signed)
Lab and CXR report printed and placed on CY's cart to advise.

## 2013-11-05 NOTE — Telephone Encounter (Signed)
CXR is clear- no pneumonia.  WBC is normal, so there is no bacterial infection now, unless he has a sinusitis. More likely this is a post-viral bronchitis, which is an airway burn and can be very slow to clear.  Offer prometh codeine cough syrup, 200 ml, 1 tsp every 6 hours if needed

## 2013-11-05 NOTE — Telephone Encounter (Signed)
Per CY-we are booked today; please have patient come by for Outpatient CXR dx bronchitis(STAT) and CBC diff(STAT) ; we will call with recommendations once we get the results.

## 2013-11-05 NOTE — Telephone Encounter (Signed)
Pt advised of cxr and cbc results per Dr young.  Advised that Rx for cough syrup was called to pharmacy.

## 2013-12-22 ENCOUNTER — Encounter: Payer: Self-pay | Admitting: *Deleted

## 2014-02-26 ENCOUNTER — Other Ambulatory Visit: Payer: Self-pay

## 2014-02-26 MED ORDER — VALSARTAN 160 MG PO TABS: 160.0000 mg | ORAL_TABLET | Freq: Every day | ORAL | Status: DC

## 2014-02-26 MED ORDER — AMLODIPINE BESYLATE 2.5 MG PO TABS: ORAL_TABLET | ORAL | Status: DC

## 2014-03-26 ENCOUNTER — Ambulatory Visit
Admission: RE | Admit: 2014-03-26 | Discharge: 2014-03-26 | Disposition: A | Discharge status: Home / Self Care | Payer: BC Managed Care – PPO | Source: Ambulatory Visit | Attending: Urology | Admitting: Urology

## 2014-03-26 ENCOUNTER — Other Ambulatory Visit: Payer: Self-pay | Admitting: Urology

## 2014-03-26 ENCOUNTER — Other Ambulatory Visit: Payer: Self-pay

## 2014-03-26 DIAGNOSIS — N200 Calculus of kidney: Secondary | ICD-10-CM

## 2014-03-26 MED ORDER — FUROSEMIDE 20 MG PO TABS: 20.0000 mg | ORAL_TABLET | Freq: Every day | ORAL | Status: DC

## 2014-03-31 ENCOUNTER — Other Ambulatory Visit: Payer: Self-pay | Admitting: Internal Medicine

## 2014-04-15 ENCOUNTER — Other Ambulatory Visit: Payer: Self-pay | Admitting: Urology

## 2014-04-15 DIAGNOSIS — N200 Calculus of kidney: Secondary | ICD-10-CM

## 2014-04-15 DIAGNOSIS — E291 Testicular hypofunction: Secondary | ICD-10-CM

## 2014-04-22 ENCOUNTER — Encounter (INDEPENDENT_AMBULATORY_CARE_PROVIDER_SITE_OTHER): Payer: Self-pay

## 2014-04-22 ENCOUNTER — Ambulatory Visit
Admission: RE | Admit: 2014-04-22 | Discharge: 2014-04-22 | Disposition: A | Discharge status: Home / Self Care | Payer: BC Managed Care – PPO | Source: Ambulatory Visit | Attending: Urology | Admitting: Urology

## 2014-04-22 DIAGNOSIS — E291 Testicular hypofunction: Secondary | ICD-10-CM

## 2014-04-22 DIAGNOSIS — N200 Calculus of kidney: Secondary | ICD-10-CM

## 2014-06-16 ENCOUNTER — Other Ambulatory Visit: Payer: Self-pay | Admitting: Cardiology

## 2014-06-16 ENCOUNTER — Other Ambulatory Visit: Payer: Self-pay | Admitting: Internal Medicine

## 2014-06-17 ENCOUNTER — Other Ambulatory Visit: Payer: Self-pay

## 2014-06-17 MED ORDER — AMLODIPINE BESYLATE 2.5 MG PO TABS: ORAL_TABLET | ORAL | Status: DC

## 2014-07-27 ENCOUNTER — Other Ambulatory Visit: Payer: Self-pay | Admitting: Cardiology

## 2014-08-08 ENCOUNTER — Emergency Department (HOSPITAL_COMMUNITY): Payer: BC Managed Care – PPO

## 2014-08-08 ENCOUNTER — Encounter (HOSPITAL_COMMUNITY): Payer: Self-pay | Admitting: Emergency Medicine

## 2014-08-08 ENCOUNTER — Emergency Department (HOSPITAL_COMMUNITY)
Admission: EM | Admit: 2014-08-08 | Discharge: 2014-08-08 | Disposition: A | Discharge status: Home / Self Care | Payer: BC Managed Care – PPO | Attending: Emergency Medicine | Admitting: Emergency Medicine

## 2014-08-08 DIAGNOSIS — Y9389 Activity, other specified: Secondary | ICD-10-CM | POA: Insufficient documentation

## 2014-08-08 DIAGNOSIS — J45909 Unspecified asthma, uncomplicated: Secondary | ICD-10-CM | POA: Insufficient documentation

## 2014-08-08 DIAGNOSIS — I10 Essential (primary) hypertension: Secondary | ICD-10-CM | POA: Diagnosis not present

## 2014-08-08 DIAGNOSIS — E669 Obesity, unspecified: Secondary | ICD-10-CM | POA: Insufficient documentation

## 2014-08-08 DIAGNOSIS — Z87442 Personal history of urinary calculi: Secondary | ICD-10-CM | POA: Diagnosis not present

## 2014-08-08 DIAGNOSIS — Z7951 Long term (current) use of inhaled steroids: Secondary | ICD-10-CM | POA: Insufficient documentation

## 2014-08-08 DIAGNOSIS — X58XXXA Exposure to other specified factors, initial encounter: Secondary | ICD-10-CM | POA: Diagnosis not present

## 2014-08-08 DIAGNOSIS — G4733 Obstructive sleep apnea (adult) (pediatric): Secondary | ICD-10-CM | POA: Insufficient documentation

## 2014-08-08 DIAGNOSIS — Y9289 Other specified places as the place of occurrence of the external cause: Secondary | ICD-10-CM | POA: Diagnosis not present

## 2014-08-08 DIAGNOSIS — M25521 Pain in right elbow: Secondary | ICD-10-CM

## 2014-08-08 DIAGNOSIS — Z79899 Other long term (current) drug therapy: Secondary | ICD-10-CM | POA: Insufficient documentation

## 2014-08-08 DIAGNOSIS — Z9981 Dependence on supplemental oxygen: Secondary | ICD-10-CM | POA: Diagnosis not present

## 2014-08-08 DIAGNOSIS — Z7982 Long term (current) use of aspirin: Secondary | ICD-10-CM | POA: Diagnosis not present

## 2014-08-08 DIAGNOSIS — S59901A Unspecified injury of right elbow, initial encounter: Secondary | ICD-10-CM | POA: Insufficient documentation

## 2014-08-08 DIAGNOSIS — F329 Major depressive disorder, single episode, unspecified: Secondary | ICD-10-CM | POA: Diagnosis not present

## 2014-08-08 DIAGNOSIS — T1490XA Injury, unspecified, initial encounter: Secondary | ICD-10-CM

## 2014-08-08 MED ORDER — MELOXICAM 15 MG PO TABS: 15.0000 mg | ORAL_TABLET | Freq: Every day | ORAL | Status: DC

## 2014-08-08 NOTE — ED Provider Notes (Signed)
CSN: [id number]     Arrival date & time 08/08/14  2106 History   First MD Initiated Contact with Patient 08/08/14 2133     This chart was scribed for non-physician practitioner, Arthor CaptainAbigail Chihiro Frey, PA-C, working with Linwood DibblesJon Knapp, MD by Arlan OrganAshley Leger, ED Scribe. This patient was seen in room WTR5/WTR5 and the patient's care was started at 9:59 PM.   Chief Complaint  Patient presents with  . Arm Injury    right   The history is provided by the patient. No language interpreter was used.    HPI Comments: [name] Brown is a 63 y.o. male who presents to the Emergency Department complaining of arm injury sustained just prior to arrival. Pt states he was attempting to raise up a window over a piece of furniture. States the window was stuck so he force it open with his L arm resulting in a "pop and crack". He now c/o constant, moderate L arm pain that in unchanged. Pain is exacerbated with certain movements. Discomfort is alleviated when resting arm across his chest. He has tried OTC Liquid Gel Advil with mild improvement for symptoms. Last dose of Advil at 6:00 PM. He denies any fever or chills. No numbness or paresthesia to the arm. Pt with known allergy to Biaxin.  No bony tenderness on L condyles or on olecranon process Tenderness to palpation bicep tendon insertion  Pain with flexion of L elbow Good strength in L arm Distal pulses intact  Past Medical History  Diagnosis Date  . Asthma   . Allergic rhinitis   . OSA on CPAP   . Hypertension   . Obesity (BMI 30-39.9)   . Depressed   . Restless legs syndrome (RLS)   . Kidney stones    History reviewed. No pertinent past surgical history. Family History  Problem Relation Age of Onset  . Lung cancer Mother   . Stroke Father    History  Substance Use Topics  . Smoking status: Never Smoker   . Smokeless tobacco: Not on file  . Alcohol Use: Yes     Comment: occasional beer an liquor    Review of Systems  Constitutional: Negative for  fever and chills.  Musculoskeletal: Positive for arthralgias.  Neurological: Negative for weakness and numbness.      Allergies  Biaxin  Home Medications   Prior to Admission medications   Medication Sig Start Date End Date Taking? Authorizing Provider  acetaminophen (TYLENOL) 500 MG tablet Take 1,000 mg by mouth every 6 (six) hours as needed. For pain    Historical Provider, MD  albuterol (PROVENTIL HFA;VENTOLIN HFA) 108 (90 BASE) MCG/ACT inhaler Inhale 2 puffs into the lungs every 6 (six) hours as needed for wheezing or shortness of breath. 09/14/13   Waymon Budgelinton D Young, MD  amitriptyline (ELAVIL) 10 MG tablet Take 20 mg by mouth at bedtime.      Historical Provider, MD  amLODipine (NORVASC) 2.5 MG tablet TAKE 1 TABLET BY MOUTH DAILY 06/17/14   Quintella Reichertraci R Turner, MD  ANDROGEL PUMP 20.25 MG/ACT (1.62%) GEL Apply 4 application topically daily at 6 (six) AM. Use as directed 08/02/11   Historical Provider, MD  aspirin EC 81 MG tablet Take 81 mg by mouth daily.      Historical Provider, MD  clonazePAM (KLONOPIN) 0.5 MG tablet Take 0.5 mg by mouth at bedtime.      Historical Provider, MD  fexofenadine (ALLEGRA) 60 MG tablet Take 1 tablet (60 mg total) by mouth daily. 09/14/13  Waymon Budge, MD  Fluticasone-Salmeterol (ADVAIR DISKUS) 100-50 MCG/DOSE AEPB 1 puff then rinse mouth, twice daily 09/14/13   Waymon Budge, MD  furosemide (LASIX) 20 MG tablet Take 1 tablet (20 mg total) by mouth daily. 03/26/14   Quintella Reichert, MD  ibuprofen (ADVIL,MOTRIN) 200 MG tablet Take 600 mg by mouth every 6 (six) hours as needed. For pain    Historical Provider, MD  methylPREDNISolone (MEDROL) 4 MG tablet 4 X 2 DAYS, 3 X 2 DAYS, 2 X 2 DAYS, 1 X 2 DAYS 09/14/13 09/14/14  Waymon Budge, MD  Multiple Vitamin (MULTIVITAMIN) tablet Take 1 tablet by mouth daily.      Historical Provider, MD  promethazine-codeine (PHENERGAN WITH CODEINE) 6.25-10 MG/5ML syrup Take 5 mLs by mouth every 6 (six) hours as needed for cough.  11/05/13   Waymon Budge, MD  sertraline (ZOLOFT) 50 MG tablet Take 50 mg by mouth daily.      Historical Provider, MD  SUMAtriptan (IMITREX) 100 MG tablet  06/03/11   Historical Provider, MD  valACYclovir (VALTREX) 1000 MG tablet Take 1,000 mg by mouth as needed.     Historical Provider, MD  valsartan (DIOVAN) 160 MG tablet TAKE 1 TABLET BY MOUTH DAILY. 07/29/14   Quintella Reichert, MD  zafirlukast (ACCOLATE) 20 MG tablet TAKE 1 TABLET BY MOUTH 2 TIMES DAILY. 06/17/14   Waymon Budge, MD   Triage Vitals: BP 146/71  Pulse 105  Temp(Src) 97.9 F (36.6 C) (Oral)  Resp 20  SpO2 100%   Physical Exam  Nursing note and vitals reviewed. Constitutional: He is oriented to person, place, and time. He appears well-developed and well-nourished.  HENT:  Head: Normocephalic.  Eyes: EOM are normal.  Neck: Normal range of motion.  Cardiovascular: Normal rate, regular rhythm and normal heart sounds.   Pulmonary/Chest: Effort normal and breath sounds normal.  Abdominal: He exhibits no distension.  Musculoskeletal: Normal range of motion. He exhibits tenderness.  No bony tenderness on L condyles or on olecranon process Tenderness to palpation bicep tendon insertion  Pain with flexion of L elbow Good strength in L arm Distal pulses intact  Neurological: He is alert and oriented to person, place, and time.  Psychiatric: He has a normal mood and affect.    ED Course  Procedures (including critical care time)  DIAGNOSTIC STUDIES: Oxygen Saturation is 100% on RA, Normal by my interpretation.    COORDINATION OF CARE: 9:36 PM-Discussed treatment plan with pt at bedside and pt agreed to plan.     Labs Review Labs Reviewed - No data to display  Imaging Review No results found.   EKG Interpretation None      MDM   Final diagnoses:  Injury  Elbow pain, right    Patient X-Ray negative for obvious fracture or dislocation.Concern for partial biceps tendon disruption on the R.  Pain managed in  ED. Pt advised to follow up with orthopedics if symptoms persist for possibility of missed fracture diagnosis. Patient given brace while in ED, conservative therapy recommended and discussed. Patient will be dc home & is agreeable with above plan.   I personally performed the services described in this documentation, which was scribed in my presence. The recorded information has been reviewed and is accurate.    Arthor Captain, PA-C 08/08/14 2223

## 2014-08-08 NOTE — Discharge Instructions (Signed)
Biceps Tendon Disruption (Distal) with Rehab The biceps tendon attaches the biceps muscle to the bones of the elbow and the shoulder. A distal biceps tendon disruption is a tear of this tendon at the end the attached near the elbow. A distal biceps tendon rupture is an uncommon injury. These injuries usually involve a complete tear of the tendon from the bone; however, partial tears are also possible. The bicep muscle works with other muscles to bend the elbow and rotate the palm upward (supinate). A complete biceps rupture will result in approximately a 30% decrease in elbow bending strength and a 40% decrease in one's ability to supinate the wrist. SYMPTOMS   Pain, tenderness, swelling, warmth, or redness at the elbow, usually in the front of the elbow.  Pain that worsens with flexion of the elbow against resistance and when straightening the elbow.  Bulge can be seen and felt in the arm.  Bruising (contusion) in the elbow or forearm after 24 hours.  Limited motion of the elbow.  Weakness with attempted elbow bending (lifting or carrying) or rotation of the wrist (like when using a screwdriver).  A crackling sound (crepitation) when the tendon or elbow is moved or touched. CAUSES  A biceps tendon rupture occurs when the tendon is subjected to a force that is greater than it can withstand, such as straightening the elbow while the biceps is contracted or direct trauma (rare). RISK INCREASES WITH:   Sports that involve contact, as well as throwing sports, gymnastics, weightlifting, and bodybuilding.  Heavy labor.  Poor strength and flexibility.  Failure to warm-up properly before activity. PREVENTION  Warm up and stretch appropriately before activity.  Maintain physical fitness:  Strength, flexibility, and endurance.  Cardiovascular fitness  Allow your body to recover between practices and competition.  Learn and use proper technique. PROGNOSIS  Surgery is usually required  to fix distal biceps tendon rupture. After surgery, a recovery period of 4 to 8 months can be expected to allow for healing and a return to sports.  RELATED COMPLICATIONS  Weakness of elbow bending and forearm rotation, especially if treated non-surgically.  Prolonged disability.  Re-rupture of the tendon after surgery.  Risks of surgery, including infection, bleeding, injury to nerves, elbow or wrist stiffness or loss of motion, and weakness of elbow bending or wrist rotation. TREATMENT  Treatment initially consists of ice and medication to help reduce pain and inflammation. A sling may also be worn to increase one's comfort. Surgery is required for a full recovery and return to sports. Surgery involves reattaching the tendon to the bone. Weakness can be expected if surgery is not performed; however, this may be acceptable for sedentary individuals. Surgery is usually followed by immobilization and rehabilitation exercises to regain strength and a full range of motion.  MEDICATION  If pain medication is necessary, nonsteroidal anti-inflammatory medications, such as aspirin and ibuprofen, or other minor pain relievers, such as acetaminophen, are often recommended.  Do not take pain medication for 7 days before surgery.  Prescription pain relievers may be given if deemed necessary by your caregiver. Use only as directed and only as much as you need. HEAT AND COLD  Cold treatment (icing) relieves pain and reduces inflammation. Cold treatment should be applied for 10 to 15 minutes every 2 to 3 hours for inflammation and pain and immediately after any activity that aggravates your symptoms. Use ice packs or an ice massage.  Heat treatment may be used prior to performing the stretching and strengthening   activities prescribed by your caregiver, physical therapist, or athletic trainer. Use a heat pack or a warm soak. SEEK MEDICAL CARE IF:   Symptoms get worse or do not improve in 2 weeks despite  treatment.  You experience pain, numbness, or coldness in the hand.  Blue, gray, or dark color appears in the fingernails.  Any of the following occur after surgery:  Increased pain, swelling, redness, drainage, or bleeding in the surgical area.  Signs of infection (headache, muscle aches, dizziness, or a general ill feeling with fever).  New, unexplained symptoms develop (drugs used in treatment may produce side effects). EXERCISES RANGE OF MOTION (ROM) AND STRETCHING EXERCISES - Biceps Tendon Disruption (Distal) Once your physician, physical therapist or athletic trainer has permitted you to discontinue using your brace or splint, you may begin to restore your elbow motion by using these exercises. Beginning these exercises before your provider's approval may result in delayed healing. While completing these exercises, remember:   Restoring tissue flexibility helps normal motion to return to the joints. This allows healthier, less painful movement and activity.  An effective stretch should be held for at least 30 seconds.  A stretch should never be painful. You should only feel a gentle lengthening or release in the stretched tissue. RANGE OF MOTION - Extension  Hold your right / left arm at your side and straighten your elbow as far as you can using your right / left arm muscles.  Straighten the right / left elbow farther by gently pushing down on your forearm until you feel a gentle stretch on the inside of your elbow. Hold this position for __________ seconds.  Slowly return to the starting position. Repeat __________ times. Complete this exercise __________ times per day.  RANGE OF MOTION - Flexion  Hold your right / left arm at your side and bend your elbow as far as you can using your right / left arm muscles.  Bend the right / left elbow farther by gently pushing up on your forearm until you feel a gentle stretch on the outside of your elbow. Hold this position for  __________ seconds.  Slowly return to the starting position. Repeat __________ times. Complete this exercise __________ times per day.  RANGE OF MOTION - Supination, Active   Stand or sit with your elbows at your side. Bend your right / left elbow to 90 degrees.  Turn your palm upward until you feel a gentle stretch on the inside of your forearm.  Hold this position for __________ seconds. Slowly release and return to the starting position. Repeat __________ times. Complete this stretch __________ times per day.  RANGE OF MOTION - Pronation, Active   Stand or sit with your elbows at your side. Bend your right / left elbow to 90 degrees.  Turn your palm downward until you feel a gentle stretch on the top of your forearm.  Hold this position for __________ seconds. Slowly release and return to the starting position. Repeat __________ times. Complete this stretch __________ times per day.  STRENGTHENING EXERCISES - Biceps Tendon Disruption (Distal) Once your physician, physical therapist, or athletic trainer has permitted you to discontinue using your brace or splint, you may begin restoring your arm strength by using these exercises. Beginning these before your provider's approval may result in delayed healing. While completing these exercises, remember:   Muscles can gain both the endurance and the strength needed for everyday activities through controlled exercises.  Complete these exercises as instructed by your physician,   physical therapist, or athletic trainer. Progress the resistance and repetitions only as guided.  You may experience muscle soreness or fatigue, but the pain or discomfort you are trying to eliminate should never worsen during these exercises. If this pain does worsen, stop and make certain you are following the directions exactly. If the pain is still present after adjustments, discontinue the exercise until you can discuss the trouble with your clinician. STRENGTH -  Elbow Flexors, Isometric   Stand or sit upright on a firm surface. Place your right / left arm so that your hand is palm-up and at the height of your waist.  Place your opposite hand on top of your forearm. Gently push down as your right / left arm resists. Push as hard as you can with both arms without causing any pain or movement at your right / left elbow. Hold this stationary position for __________ seconds.  Gradually release the tension in both arms. Allow your muscles to relax completely before repeating. Repeat __________ times. Complete this exercise __________ times per day. STRENGTH - Elbow Extensors, Isometric   Stand or sit upright on a firm surface. Place your right / left arm so that your palm faces your abdomen and it is at the height of your waist.  Place your opposite hand on the underside of your forearm. Gently push up as your right / left arm resists. Push as hard as you can with both arms without causing any pain or movement at your right / left elbow. Hold this stationary position for __________ seconds.  Gradually release the tension in both arms. Allow your muscles to relax completely before repeating. Repeat __________ times. Complete this exercise __________ times per day. STRENGTH - Elbow Flexors, Supinated  With good posture, stand or sit on a firm chair without armrests. Allow your right / left arm to rest at your side with your palm facing forward.  Holding a __________ weight or gripping a rubber exercise band/tubing, bring your right / left hand toward your shoulder.  Allow your muscles to control the resistance as your hand returns to your side. Repeat __________ times. Complete this exercise __________ times per day.  STRENGTH - Elbow Flexors, Neutral  With good posture, stand or sit on a firm chair without armrests. Allow your right / left arm to rest at your side with your thumb facing forward.  Holding a __________ weight or gripping a rubber exercise  band/tubing, bring your right / left hand toward your shoulder.  Allow your muscles to control the resistance as your hand returns to your side. Repeat __________ times. Complete this exercise __________ times per day.  STRENGTH - Elbow Extensors  Lie on your back. Extend your right / left elbow into the air, pointing it toward the ceiling. Brace your arm with your opposite hand.*  Holding a __________ weight in your hand, slowly straighten your right / left elbow.  Allow your muscles to control the weight as your hand returns to its starting position. Repeat __________ times. Complete this exercise __________ times per day. *You may also stand with your elbow overhead and pointed toward the ceiling and supported by your opposite hand. STRENGTH - Elbow Extensors, Dynamic  With good posture, stand or sit on a firm chair without armrests. Keeping your upper arms at your side, bring both hands up to your right / left shoulder while gripping a rubber exercise band/tubing. Your right / left hand should be just below the other hand.  Straighten your   right / left elbow. Hold for __________ seconds.  Allow your muscles to control the rubber exercise band/tubing as your hand returns to your shoulder. Repeat __________ times. Complete this exercise __________ times per day. STRENGTH - Forearm Supinators   Sit with your right / left forearm supported on a table, keeping your elbow below shoulder height. Rest your hand over the edge, palm down.  Gently grip a hammer or a soup ladle.  Without moving your elbow, slowly turn your palm and hand upward to a "thumbs-up" position.  Hold this position for __________ seconds. Slowly return to the starting position. Repeat __________ times. Complete this exercise __________ times per day.  STRENGTH - Forearm Pronators   Sit with your right / left forearm supported on a table, keeping your elbow below shoulder height. Rest your hand over the edge, palm  up.  Gently grip a hammer or a soup ladle.  Without moving your elbow, slowly turn your palm and hand upward to a "thumbs-up" position.  Hold this position for __________ seconds. Slowly return to the starting position. Repeat __________ times. Complete this exercise __________ times per day.  Document Released: 10/18/2005 Document Revised: 01/10/2012 Document Reviewed: 01/30/2009 [employer] Patient Information 2015 [employer]. This information is not intended to replace advice given to you by your health care provider. Make sure you discuss any questions you have with your health care provider.  

## 2014-08-08 NOTE — ED Notes (Signed)
Per patient-was bending over a piece of furniture trying to open a window and felt "something in my arm crackle and pop." No obvious deformities. Visualized full ROM with right arm but feels a "bad strain" when moved in certain positions. Took two liquid gel Advil at 1800. Denies numbness or tingling. No other questions/concerns.

## 2014-08-09 NOTE — ED Provider Notes (Signed)
Medical screening examination/treatment/procedure(s) were performed by non-physician practitioner and as supervising physician I was immediately available for consultation/collaboration.    Joe Gee, MD 08/09/14 0055 

## 2014-08-17 ENCOUNTER — Other Ambulatory Visit: Payer: Self-pay | Admitting: Cardiology

## 2014-08-17 NOTE — Telephone Encounter (Signed)
Rx was sent to pharmacy electronically. 

## 2014-08-19 ENCOUNTER — Other Ambulatory Visit (HOSPITAL_COMMUNITY): Payer: Self-pay | Admitting: Orthopaedic Surgery

## 2014-08-20 ENCOUNTER — Encounter (HOSPITAL_COMMUNITY): Payer: Self-pay | Admitting: Pharmacy Technician

## 2014-08-21 ENCOUNTER — Encounter (HOSPITAL_COMMUNITY): Payer: Self-pay | Admitting: *Deleted

## 2014-08-21 MED ORDER — CEFAZOLIN SODIUM-DEXTROSE 2-3 GM-% IV SOLR
2.0000 g | INTRAVENOUS | Status: AC
  Administered 2014-08-22: 2 g via INTRAVENOUS
  Filled 2014-08-21: qty 50

## 2014-08-22 ENCOUNTER — Ambulatory Visit (HOSPITAL_COMMUNITY)
Admission: RE | Admit: 2014-08-22 | Discharge: 2014-08-22 | Disposition: A | Discharge status: Home / Self Care | Payer: BC Managed Care – PPO | Source: Ambulatory Visit | Attending: Orthopaedic Surgery | Admitting: Orthopaedic Surgery

## 2014-08-22 ENCOUNTER — Encounter (HOSPITAL_COMMUNITY)
Admission: RE | Disposition: A | Discharge status: Home / Self Care | Payer: Self-pay | Source: Ambulatory Visit | Attending: Orthopaedic Surgery

## 2014-08-22 ENCOUNTER — Ambulatory Visit (HOSPITAL_COMMUNITY): Payer: BC Managed Care – PPO | Admitting: Certified Registered"

## 2014-08-22 ENCOUNTER — Encounter (HOSPITAL_COMMUNITY): Payer: BC Managed Care – PPO | Admitting: Certified Registered"

## 2014-08-22 ENCOUNTER — Encounter (HOSPITAL_COMMUNITY): Payer: Self-pay | Admitting: *Deleted

## 2014-08-22 DIAGNOSIS — E669 Obesity, unspecified: Secondary | ICD-10-CM | POA: Insufficient documentation

## 2014-08-22 DIAGNOSIS — J45909 Unspecified asthma, uncomplicated: Secondary | ICD-10-CM | POA: Diagnosis not present

## 2014-08-22 DIAGNOSIS — G4733 Obstructive sleep apnea (adult) (pediatric): Secondary | ICD-10-CM | POA: Insufficient documentation

## 2014-08-22 DIAGNOSIS — Y998 Other external cause status: Secondary | ICD-10-CM | POA: Insufficient documentation

## 2014-08-22 DIAGNOSIS — Z888 Allergy status to other drugs, medicaments and biological substances status: Secondary | ICD-10-CM | POA: Diagnosis not present

## 2014-08-22 DIAGNOSIS — G43909 Migraine, unspecified, not intractable, without status migrainosus: Secondary | ICD-10-CM | POA: Insufficient documentation

## 2014-08-22 DIAGNOSIS — I10 Essential (primary) hypertension: Secondary | ICD-10-CM | POA: Diagnosis not present

## 2014-08-22 DIAGNOSIS — G2581 Restless legs syndrome: Secondary | ICD-10-CM | POA: Insufficient documentation

## 2014-08-22 DIAGNOSIS — Y9389 Activity, other specified: Secondary | ICD-10-CM | POA: Diagnosis not present

## 2014-08-22 DIAGNOSIS — S46211A Strain of muscle, fascia and tendon of other parts of biceps, right arm, initial encounter: Secondary | ICD-10-CM | POA: Diagnosis present

## 2014-08-22 DIAGNOSIS — Z6833 Body mass index (BMI) 33.0-33.9, adult: Secondary | ICD-10-CM | POA: Diagnosis not present

## 2014-08-22 DIAGNOSIS — Y92098 Other place in other non-institutional residence as the place of occurrence of the external cause: Secondary | ICD-10-CM | POA: Diagnosis not present

## 2014-08-22 DIAGNOSIS — Z8619 Personal history of other infectious and parasitic diseases: Secondary | ICD-10-CM | POA: Diagnosis not present

## 2014-08-22 DIAGNOSIS — F329 Major depressive disorder, single episode, unspecified: Secondary | ICD-10-CM | POA: Diagnosis not present

## 2014-08-22 HISTORY — PX: DISTAL BICEPS TENDON REPAIR: SHX1461

## 2014-08-22 HISTORY — DX: Headache, unspecified: R51.9

## 2014-08-22 HISTORY — DX: Headache: R51

## 2014-08-22 HISTORY — DX: Inflammatory liver disease, unspecified: K75.9

## 2014-08-22 HISTORY — DX: Pneumonia, unspecified organism: J18.9

## 2014-08-22 LAB — BASIC METABOLIC PANEL
Anion gap: 13 (ref 5–15)
BUN: 23 mg/dL (ref 6–23)
CO2: 22 mEq/L (ref 19–32)
Calcium: 9.4 mg/dL (ref 8.4–10.5)
Chloride: 105 mEq/L (ref 96–112)
Creatinine, Ser: 0.92 mg/dL (ref 0.50–1.35)
GFR calc Af Amer: >90 mL/min (ref >90)
GFR calc non Af Amer: 89 mL/min — ABNORMAL LOW (ref >90)
Glucose, Bld: 118 mg/dL — ABNORMAL HIGH (ref 70–99)
Potassium: 4.5 mEq/L (ref 3.7–5.3)
Sodium: 140 mEq/L (ref 137–147)

## 2014-08-22 LAB — CBC
HCT: 42.1 % (ref 39.0–52.0)
Hemoglobin: 14.5 g/dL (ref 13.0–17.0)
MCH: 33.2 pg (ref 26.0–34.0)
MCHC: 34.4 g/dL (ref 30.0–36.0)
MCV: 96.3 fL (ref 78.0–100.0)
Platelets: 298 10*3/uL (ref 150–400)
RBC: 4.37 MIL/uL (ref 4.22–5.81)
RDW: 12.3 % (ref 11.5–15.5)
WBC: 5.6 10*3/uL (ref 4.0–10.5)

## 2014-08-22 SURGERY — REPAIR, TENDON, BICEPS, DISTAL
Anesthesia: General | Site: Arm Upper | Laterality: Right

## 2014-08-22 MED ORDER — PROPOFOL 10 MG/ML IV BOLUS
INTRAVENOUS | Status: DC | PRN
  Administered 2014-08-22: 200 mg via INTRAVENOUS

## 2014-08-22 MED ORDER — ONDANSETRON HCL 4 MG/2ML IJ SOLN: 4.0000 mg | Freq: Once | INTRAMUSCULAR | Status: DC | PRN

## 2014-08-22 MED ORDER — BUPIVACAINE-EPINEPHRINE (PF) 0.5% -1:200000 IJ SOLN
INTRAMUSCULAR | Status: DC | PRN
  Administered 2014-08-22: 30 mL via PERINEURAL

## 2014-08-22 MED ORDER — PHENYLEPHRINE HCL 10 MG/ML IJ SOLN
INTRAMUSCULAR | Status: DC | PRN
  Administered 2014-08-22 (×3): 80 ug via INTRAVENOUS
  Administered 2014-08-22: 120 ug via INTRAVENOUS
  Administered 2014-08-22 (×4): 80 ug via INTRAVENOUS

## 2014-08-22 MED ORDER — LACTATED RINGERS IV SOLN
INTRAVENOUS | Status: DC | PRN
  Administered 2014-08-22 (×2): via INTRAVENOUS

## 2014-08-22 MED ORDER — ROCURONIUM BROMIDE 50 MG/5ML IV SOLN
INTRAVENOUS | Status: AC
  Filled 2014-08-22: qty 1

## 2014-08-22 MED ORDER — HYDROMORPHONE HCL 1 MG/ML IJ SOLN: 0.2500 mg | INTRAMUSCULAR | Status: DC | PRN

## 2014-08-22 MED ORDER — OXYCODONE HCL 5 MG PO TABS: 5.0000 mg | ORAL_TABLET | Freq: Once | ORAL | Status: DC | PRN

## 2014-08-22 MED ORDER — MIDAZOLAM HCL 2 MG/2ML IJ SOLN
INTRAMUSCULAR | Status: AC
  Filled 2014-08-22: qty 2

## 2014-08-22 MED ORDER — PROPOFOL 10 MG/ML IV BOLUS
INTRAVENOUS | Status: AC
  Filled 2014-08-22: qty 20

## 2014-08-22 MED ORDER — PHENYLEPHRINE 40 MCG/ML (10ML) SYRINGE FOR IV PUSH (FOR BLOOD PRESSURE SUPPORT)
PREFILLED_SYRINGE | INTRAVENOUS | Status: AC
  Filled 2014-08-22: qty 20

## 2014-08-22 MED ORDER — ONDANSETRON HCL 4 MG/2ML IJ SOLN
INTRAMUSCULAR | Status: AC
  Filled 2014-08-22: qty 2

## 2014-08-22 MED ORDER — LIDOCAINE HCL (CARDIAC) 20 MG/ML IV SOLN
INTRAVENOUS | Status: DC | PRN
  Administered 2014-08-22: 50 mg via INTRAVENOUS

## 2014-08-22 MED ORDER — MEPERIDINE HCL 25 MG/ML IJ SOLN: 6.2500 mg | INTRAMUSCULAR | Status: DC | PRN

## 2014-08-22 MED ORDER — FENTANYL CITRATE 0.05 MG/ML IJ SOLN
INTRAMUSCULAR | Status: DC | PRN
  Administered 2014-08-22: 50 ug via INTRAVENOUS
  Administered 2014-08-22: 100 ug via INTRAVENOUS
  Administered 2014-08-22: 50 ug via INTRAVENOUS

## 2014-08-22 MED ORDER — SODIUM CHLORIDE 0.9 % IJ SOLN
INTRAMUSCULAR | Status: AC
  Filled 2014-08-22: qty 20

## 2014-08-22 MED ORDER — 0.9 % SODIUM CHLORIDE (POUR BTL) OPTIME
TOPICAL | Status: DC | PRN
  Administered 2014-08-22: 1000 mL

## 2014-08-22 MED ORDER — OXYCODONE HCL 5 MG/5ML PO SOLN: 5.0000 mg | Freq: Once | ORAL | Status: DC | PRN

## 2014-08-22 MED ORDER — MIDAZOLAM HCL 5 MG/5ML IJ SOLN
INTRAMUSCULAR | Status: DC | PRN
  Administered 2014-08-22: 2 mg via INTRAVENOUS

## 2014-08-22 MED ORDER — ONDANSETRON HCL 4 MG/2ML IJ SOLN
INTRAMUSCULAR | Status: DC | PRN
  Administered 2014-08-22: 4 mg via INTRAVENOUS

## 2014-08-22 MED ORDER — LIDOCAINE HCL (CARDIAC) 20 MG/ML IV SOLN
INTRAVENOUS | Status: AC
  Filled 2014-08-22: qty 5

## 2014-08-22 MED ORDER — BUPIVACAINE-EPINEPHRINE (PF) 0.5% -1:200000 IJ SOLN
INTRAMUSCULAR | Status: AC
  Filled 2014-08-22: qty 30

## 2014-08-22 MED ORDER — PHENYLEPHRINE HCL 10 MG/ML IJ SOLN
10.0000 mg | INTRAVENOUS | Status: DC | PRN
  Administered 2014-08-22: 20 ug/min via INTRAVENOUS

## 2014-08-22 MED ORDER — SUCCINYLCHOLINE CHLORIDE 20 MG/ML IJ SOLN
INTRAMUSCULAR | Status: AC
  Filled 2014-08-22: qty 1

## 2014-08-22 MED ORDER — FENTANYL CITRATE 0.05 MG/ML IJ SOLN
INTRAMUSCULAR | Status: AC
  Filled 2014-08-22: qty 5

## 2014-08-22 MED ORDER — LACTATED RINGERS IV SOLN
INTRAVENOUS | Status: DC
  Administered 2014-08-22: 08:00:00 via INTRAVENOUS

## 2014-08-22 MED ORDER — EPHEDRINE SULFATE 50 MG/ML IJ SOLN
INTRAMUSCULAR | Status: AC
  Filled 2014-08-22: qty 1

## 2014-08-22 MED ORDER — OXYCODONE-ACETAMINOPHEN 5-325 MG PO TABS: 1.0000 | ORAL_TABLET | ORAL | Status: DC | PRN

## 2014-08-22 SURGICAL SUPPLY — 51 items
ANCHOR BUTTON TIGHTROPE ABS (Orthopedic Implant) ×3 IMPLANT
BANDAGE ELASTIC 6 VELCRO ST LF (GAUZE/BANDAGES/DRESSINGS) ×3 IMPLANT
BNDG GAUZE ELAST 4 BULKY (GAUZE/BANDAGES/DRESSINGS) ×3 IMPLANT
DRAPE C-ARM 42X72 X-RAY (DRAPES) ×3 IMPLANT
DRAPE INCISE IOBAN 66X45 STRL (DRAPES) ×3 IMPLANT
DRAPE PROXIMA HALF (DRAPES) ×3 IMPLANT
DRAPE U-SHAPE 47X51 STRL (DRAPES) ×3 IMPLANT
DRSG PAD ABDOMINAL 8X10 ST (GAUZE/BANDAGES/DRESSINGS) ×6 IMPLANT
ELECT REM PT RETURN 9FT ADLT (ELECTROSURGICAL) ×3
ELECTRODE REM PT RTRN 9FT ADLT (ELECTROSURGICAL) ×1 IMPLANT
GAUZE SPONGE 4X4 12PLY STRL (GAUZE/BANDAGES/DRESSINGS) ×3 IMPLANT
GAUZE XEROFORM 1X8 LF (GAUZE/BANDAGES/DRESSINGS) ×3 IMPLANT
GLOVE BIO SURGEON STRL SZ8 (GLOVE) ×3 IMPLANT
GLOVE BIOGEL PI IND STRL 6.5 (GLOVE) ×2 IMPLANT
GLOVE BIOGEL PI IND STRL 8 (GLOVE) ×2 IMPLANT
GLOVE BIOGEL PI INDICATOR 6.5 (GLOVE) ×4
GLOVE BIOGEL PI INDICATOR 8 (GLOVE) ×4
GLOVE ECLIPSE 6.5 STRL STRAW (GLOVE) ×3 IMPLANT
GLOVE ORTHO TXT STRL SZ7.5 (GLOVE) ×3 IMPLANT
GOWN STRL REUS W/ TWL LRG LVL3 (GOWN DISPOSABLE) ×1 IMPLANT
GOWN STRL REUS W/ TWL XL LVL3 (GOWN DISPOSABLE) ×4 IMPLANT
GOWN STRL REUS W/TWL LRG LVL3 (GOWN DISPOSABLE) ×2
GOWN STRL REUS W/TWL XL LVL3 (GOWN DISPOSABLE) ×8
INSERTER BUTTON (SYSTAGENIX WOUND MANAGEMENT) ×3 IMPLANT
KIT BASIN OR (CUSTOM PROCEDURE TRAY) ×3 IMPLANT
KIT ROOM TURNOVER OR (KITS) ×3 IMPLANT
MANIFOLD NEPTUNE II (INSTRUMENTS) ×3 IMPLANT
NDL SUT 1 .5 CRC FRENCH EYE (NEEDLE) ×1 IMPLANT
NEEDLE 22X1 1/2 (OR ONLY) (NEEDLE) IMPLANT
NEEDLE FRENCH EYE (NEEDLE) ×2
NS IRRIG 1000ML POUR BTL (IV SOLUTION) ×3 IMPLANT
PACK SHOULDER (CUSTOM PROCEDURE TRAY) ×3 IMPLANT
PAD ARMBOARD 7.5X6 YLW CONV (MISCELLANEOUS) ×6 IMPLANT
PIN DRILL ACL TIGHTROPE 4MM (PIN) ×3 IMPLANT
SLING ARM IMMOBILIZER XL (CAST SUPPLIES) ×3 IMPLANT
SPONGE GAUZE 4X4 12PLY STER LF (GAUZE/BANDAGES/DRESSINGS) ×3 IMPLANT
SPONGE LAP 4X18 X RAY DECT (DISPOSABLE) ×6 IMPLANT
STAPLER VISISTAT 35W (STAPLE) ×3 IMPLANT
SUCTION FRAZIER TIP 10 FR DISP (SUCTIONS) ×3 IMPLANT
SUT ETHILON 3 0 PS 1 (SUTURE) ×3 IMPLANT
SUT SILK 2 0 (SUTURE) ×2
SUT SILK 2-0 18XBRD TIE 12 (SUTURE) ×1 IMPLANT
SUT VIC AB 0 CT1 27 (SUTURE) ×4
SUT VIC AB 0 CT1 27XBRD ANBCTR (SUTURE) ×2 IMPLANT
SUT VIC AB 2-0 CT1 27 (SUTURE) ×4
SUT VIC AB 2-0 CT1 TAPERPNT 27 (SUTURE) ×2 IMPLANT
SYR CONTROL 10ML LL (SYRINGE) IMPLANT
TOWEL OR 17X24 6PK STRL BLUE (TOWEL DISPOSABLE) ×3 IMPLANT
TOWEL OR 17X26 10 PK STRL BLUE (TOWEL DISPOSABLE) ×3 IMPLANT
WATER STERILE IRR 1000ML POUR (IV SOLUTION) ×3 IMPLANT
YANKAUER SUCT BULB TIP NO VENT (SUCTIONS) ×3 IMPLANT

## 2014-08-22 NOTE — Transfer of Care (Signed)
Immediate Anesthesia Transfer of Care Note  Patient: [name] [name]  Procedure(s) Performed: Procedure(s): RIGHT DISTAL BICEPS TENDON DIRECT PRIMARY REPAIR (Right)  Patient Location: PACU  Anesthesia Type:General  Level of Consciousness: awake, alert  and oriented  Airway & Oxygen Therapy: Patient Spontanous Breathing and Patient connected to nasal cannula oxygen  Post-op Assessment: Report given to PACU RN and Post -op Vital signs reviewed and stable  Post vital signs: Reviewed and stable  Complications: No apparent anesthesia complications

## 2014-08-22 NOTE — H&P (Signed)
[name] [name] is an 63 y.o. male.   Chief Complaint:   Right arm pain and weakness; known distal biceps tendon rupture HPI:   63 yo right-hand dominant male who sustained an acute injury to his right arm trying to force open a stuck window.  On exam, he has profound right arm biceps weakness and a palpable deficit of the distal biceps tendon.  A MRI does show an acute complete rupture of his right distal biceps.  Surgery is recommended to repair the tendon.  He understands full the risks of infection, nerve and vessel injury, and re-rupture.  Past Medical History  Diagnosis Date  . Asthma   . Allergic rhinitis   . OSA on CPAP   . Hypertension   . Obesity (BMI 30-39.9)   . Depressed   . Restless legs syndrome (RLS)   . Kidney stones   . Pneumonia   . Headache     migraines  . Hepatitis     PMH: HEP A    Past Surgical History  Procedure Laterality Date  . Colonoscopy w/ biopsies and polypectomy      Family History  Problem Relation Age of Onset  . Lung cancer Mother   . Stroke Father    Social History:  reports that he has never smoked. He has never used smokeless tobacco. He reports that he drinks alcohol. He reports that he does not use illicit drugs.  Allergies:  Allergies  Allergen Reactions  . Biaxin [Clarithromycin] Other (See Comments)    Stomach upset    Medications Prior to Admission  Medication Sig Dispense Refill  . amitriptyline (ELAVIL) 10 MG tablet Take 20 mg by mouth at bedtime.        Marland Kitchen amLODipine (NORVASC) 2.5 MG tablet Take 2.5 mg by mouth daily.      . ANDROGEL PUMP 20.25 MG/ACT (1.62%) GEL Apply 3 application topically daily at 6 (six) AM. Use as directed      . aspirin EC 81 MG tablet Take 81 mg by mouth daily.        . clonazePAM (KLONOPIN) 0.5 MG tablet Take 0.5 mg by mouth at bedtime.        . fexofenadine (ALLEGRA) 60 MG tablet Take 1 tablet (60 mg total) by mouth daily.  30 tablet  prn  . Fluticasone-Salmeterol (ADVAIR) 100-50 MCG/DOSE AEPB  Inhale 1 puff into the lungs 2 (two) times daily.      . furosemide (LASIX) 20 MG tablet Take 20 mg by mouth daily.      . meloxicam (MOBIC) 15 MG tablet Take 1 tablet (15 mg total) by mouth daily. Take 1 daily with food.  10 tablet  0  . Multiple Vitamin (MULTIVITAMIN) tablet Take 1 tablet by mouth daily.        . sertraline (ZOLOFT) 50 MG tablet Take 50 mg by mouth daily.        . valsartan (DIOVAN) 160 MG tablet Take 160 mg by mouth daily.      . zafirlukast (ACCOLATE) 20 MG tablet Take 20 mg by mouth daily.      Marland Kitchen albuterol (PROVENTIL HFA;VENTOLIN HFA) 108 (90 BASE) MCG/ACT inhaler Inhale 2 puffs into the lungs every 6 (six) hours as needed for wheezing or shortness of breath.  1 Inhaler  prn  . SUMAtriptan (IMITREX) 100 MG tablet Take 100 mg by mouth every 2 (two) hours as needed for migraine.       . valACYclovir (VALTREX) 1000 MG tablet  Take 1,000 mg by mouth daily as needed (outbreak).         Results for orders placed during the hospital encounter of 08/22/14 (from the past 48 hour(s))  BASIC METABOLIC PANEL     Status: Abnormal   Collection Time    08/22/14  8:03 AM      Result Value Ref Range   Sodium 140  137 - 147 mEq/L   Potassium 4.5  3.7 - 5.3 mEq/L   Chloride 105  96 - 112 mEq/L   CO2 22  19 - 32 mEq/L   Glucose, Bld 118 (*) 70 - 99 mg/dL   BUN 23  6 - 23 mg/dL   Creatinine, Ser 0.92  0.50 - 1.35 mg/dL   Calcium 9.4  8.4 - 10.5 mg/dL   GFR calc non Af Amer 89 (*) >90 mL/min   GFR calc Af Amer >90  >90 mL/min   Comment: (NOTE)     The eGFR has been calculated using the CKD EPI equation.     This calculation has not been validated in all clinical situations.     eGFR's persistently <90 mL/min signify possible Chronic Kidney     Disease.   Anion gap 13  5 - 15  CBC     Status: None   Collection Time    08/22/14  8:03 AM      Result Value Ref Range   WBC 5.6  4.0 - 10.5 K/uL   RBC 4.37  4.22 - 5.81 MIL/uL   Hemoglobin 14.5  13.0 - 17.0 g/dL   HCT 42.1  39.0 - 52.0  %   MCV 96.3  78.0 - 100.0 fL   MCH 33.2  26.0 - 34.0 pg   MCHC 34.4  30.0 - 36.0 g/dL   RDW 12.3  11.5 - 15.5 %   Platelets 298  150 - 400 K/uL   No results found.  Review of Systems  All other systems reviewed and are negative.   Blood pressure 157/74, pulse 81, temperature 98.5 F (36.9 C), temperature source Oral, resp. rate 18, height _0  (1.778 m), weight 106.595 kg (235 lb), SpO2 96.00%. Physical Exam  Constitutional: He is oriented to person, place, and time. He appears well-developed and well-nourished.  HENT:  Head: Normocephalic and atraumatic.  Eyes: EOM are normal. Pupils are equal, round, and reactive to light.  Neck: Normal range of motion. Neck supple.  Cardiovascular: Normal rate and regular rhythm.   Respiratory: Effort normal and breath sounds normal.  GI: Soft. Bowel sounds are normal.  Musculoskeletal:       Right elbow: He exhibits swelling. Tenderness found.       Arms: Neurological: He is alert and oriented to person, place, and time.  Skin: Skin is warm and dry.  Psychiatric: He has a normal mood and affect.    He has significant right arm weakness of the biceps and weakness/pain with pronation/supination at the elbow   Assessment/Plan Acute right distal biceps tendon traumatic rupture 1)  To the OR today for a direct primary repair of his ruptured right distal biceps tendon using a suture anchor.  BLACKMAN,CHRISTOPHER Y 08/22/2014, 9:19 AM

## 2014-08-22 NOTE — Discharge Instructions (Signed)
No lifting anything with your right arm until further notice. Wear your sling throughout the day and while sleeping. You can occasionally come out of your sling to rest, but no straightening of your elbow. Keep your dressing clean and dry until your outpatient follow-up.

## 2014-08-22 NOTE — Anesthesia Postprocedure Evaluation (Signed)
Anesthesia Post Note  Patient: [name] HeftBrian L Battershell  Procedure(s) Performed: Procedure(s) (LRB): RIGHT DISTAL BICEPS TENDON DIRECT PRIMARY REPAIR (Right)  Anesthesia type: general  Patient location: PACU  Post pain: Pain level controlled  Post assessment: Patient's Cardiovascular Status Stable  Last Vitals:  Filed Vitals:   08/22/14 1309  BP: 112/75  Pulse: 86  Temp: 36.7 C  Resp: 17    Post vital signs: Reviewed and stable  Level of consciousness: sedated  Complications: No apparent anesthesia complications

## 2014-08-22 NOTE — Brief Op Note (Signed)
08/22/2014  12:20 PM  PATIENT:  [name] [name]  63 y.o. male  PRE-OPERATIVE DIAGNOSIS:  Right distal biceps tendon rupture  POST-OPERATIVE DIAGNOSIS:  Right distal biceps tendon rupture  PROCEDURE:  Procedure(s): RIGHT DISTAL BICEPS TENDON DIRECT PRIMARY REPAIR (Right)  SURGEON:  Surgeon(s) and Role:    * Kathryne Hitchhristopher Y Tashawna Thom, MD - Primary  ASSISTANTS: none   ANESTHESIA:   general  EBL:  Total I/O In: 1000 [I.V.:1000] Out: 25 [Blood:25]  BLOOD ADMINISTERED:none  DRAINS: none   LOCAL MEDICATIONS USED:  NONE  SPECIMEN:  No Specimen  DISPOSITION OF SPECIMEN:  N/A  COUNTS:  YES  TOURNIQUET:  * No tourniquets in log *  DICTATION: .Other Dictation: Dictation Number [id number]  PLAN OF CARE: Discharge to home after PACU  PATIENT DISPOSITION:  PACU - hemodynamically stable.   Delay start of Pharmacological VTE agent (>24hrs) due to surgical blood loss or risk of bleeding: not applicable

## 2014-08-22 NOTE — Op Note (Signed)
NAMMarquette Saa:  [name], [name]               ACCOUNT NO.:  [id number]  MEDICAL RECORD NO.:  [id number]  LOCATION:  [facility]                         FACILITY:  [facility]  PHYSICIAN:  [name] [name], M.D.DATE OF BIRTH:  11-26-1950  DATE OF PROCEDURE:  08/22/2014 DATE OF DISCHARGE:                              OPERATIVE REPORT   PREOPERATIVE DIAGNOSIS:  Acute traumatic rupture of right distal biceps tendon.  POSTOPERATIVE DIAGNOSIS:  Acute traumatic rupture of right distal biceps tendon.  PROCEDURE:  Direct primary repair of right distal biceps tendon.  SURGEON:  [name] [name], M.D.  ANESTHESIA: 1. Regional right upper extremity supraclavicular block. 2. General.  BLOOD LOSS:  Less than 100 mL.  COMPLICATIONS:  None.  ANTIBIOTICS:  2 g of IV Ancef.  INDICATIONS:  Mr. [name] [name] is  a 63 year old gentleman who 2 weeks ago was found to have opened up the windows in his house that was stuck, he pulled hard and then felt a pop in his right arm in the antecubital fossa area.  We saw him in the office and felt that he had acute rupture of his right distal biceps tendon and this was confirmed on MRI.  He is right-hand dominant.  He understands the significant strength deficits now with having this rupture and with the risks and benefits of repair versus non-repair.  He does wish to have it repaired.  He is a heavy manual laborer.  PROCEDURE DESCRIPTION:  After informed consent was obtained, appropriate right arm was marked, and anesthesia obtained with regional block.  He was brought to the operating room, placed supine on the operating table. General anesthesia was then obtained.  His right arm was prepped and draped from the axilla down to the wrist with DuraPrep and sterile drapes including sterile stockinette around the hand and wrist.  Time- out was called, he was identified as correct patient, correct right arm. We then made an incision transversely in the antecubital fossa  across the elbow crease and dissected down to the distal biceps tendon.  We did find it to be completely ruptured and slightly retracted before so it was not retracted.  We then used an Arthrex TightRope suture system and placed a FiberWire suture in a Krackow type format to the distal biceps tendon.  We then cleaned down to the bicipital tuberosity of the proximal radius and we were able to place a guide pin through both cortices of the distal radius.  We then made a drill hole 7 mm in diameter in the near cortex on the volar aspect.  We then placed the TightRope device to the large near hole and out the small hole and then we pulled tight to have the TightRope swivel around and tighten.  We then were able to guide the biceps tendon back down into the bicipital tuberosity.  We then irrigated the soft tissue with normal saline solution.  We sewed the suture back down on itself over the biceps tendon.  We then closed the deep tissue with 2-0 Vicryl followed by 2-0 Vicryl in subcutaneous tissue, and 3-0 interrupted nylon on the skin. Xeroform and well-padded sterile dressing was applied and his arm was placed in a sling.  He was awakened, extubated, and taken to the recovery room in a stable condition.  All final counts were correct and there were no complications noted.  Postoperatively, we will discharge him to home with no coming out of sling or heavy lifting with his arm until further notice.  We will see him back in the office in 2 weeks.     [name] Pandahristopher Y. Magnus [name], M.D.     CYB/MEDQ  D:  08/22/2014  T:  08/22/2014  Job:  [id number]

## 2014-08-22 NOTE — Anesthesia Preprocedure Evaluation (Addendum)
Anesthesia Evaluation  Patient identified by MRN, date of birth, ID band Patient awake    Reviewed: Allergy & Precautions, H&P , NPO status , Patient's Chart, lab work & pertinent test results  Airway Mallampati: I TM Distance: >3 FB Neck ROM: Full    Dental   Pulmonary asthma , sleep apnea ,          Cardiovascular hypertension, Pt. on medications     Neuro/Psych Depression    GI/Hepatic   Endo/Other    Renal/GU      Musculoskeletal   Abdominal   Peds  Hematology   Anesthesia Other Findings   Reproductive/Obstetrics                          Anesthesia Physical Anesthesia Plan  ASA: II  Anesthesia Plan: General   Post-op Pain Management:    Induction: Intravenous  Airway Management Planned: Oral ETT  Additional Equipment:   Intra-op Plan:   Post-operative Plan: Extubation in OR  Informed Consent: I have reviewed the patients History and Physical, chart, labs and discussed the procedure including the risks, benefits and alternatives for the proposed anesthesia with the patient or authorized representative who has indicated his/her understanding and acceptance.     Plan Discussed with: CRNA and Surgeon  Anesthesia Plan Comments:         Anesthesia Quick Evaluation

## 2014-08-22 NOTE — Anesthesia Procedure Notes (Signed)
Anesthesia Regional Block:  Supraclavicular block  Pre-Anesthetic Checklist: ,, timeout performed, Correct Patient, Correct Site, Correct Laterality, Correct Procedure, Correct Position, site marked, Risks and benefits discussed,  Surgical consent,  Pre-op evaluation,  At surgeon's request and post-op pain management  Laterality: Right  Prep: chloraprep       Needles:   Needle Type: Echogenic Stimulator Needle     Needle Length: 9cm 9 cm Needle Gauge: 21 and 21 G    Additional Needles:  Procedures: ultrasound guided (picture in chart) and nerve stimulator Supraclavicular block  Nerve Stimulator or Paresthesia:  Response: 0.4 mA,   Additional Responses:   Narrative:  Start time: 08/22/2014 10:10 AM End time: 08/22/2014 10:20 AM Injection made incrementally with aspirations every 5 mL.  Performed by: Personally  Anesthesiologist: Arta BruceKevin Zuria Fosdick MD  Additional Notes: Monitors applied. Patient sedated. Sterile prep and drape,hand hygiene and sterile gloves were used. Relevant anatomy identified.Needle position confirmed.Local anesthetic injected incrementally after negative aspiration. Local anesthetic spread visualized around nerve(s). Vascular puncture avoided. No complications. Image printed for medical record.The patient tolerated the procedure well.

## 2014-08-23 ENCOUNTER — Encounter (HOSPITAL_COMMUNITY): Payer: Self-pay | Admitting: Orthopaedic Surgery

## 2014-08-26 ENCOUNTER — Other Ambulatory Visit: Payer: Self-pay | Admitting: Cardiology

## 2014-09-16 ENCOUNTER — Ambulatory Visit (INDEPENDENT_AMBULATORY_CARE_PROVIDER_SITE_OTHER): Payer: BC Managed Care – PPO | Admitting: Internal Medicine

## 2014-09-16 ENCOUNTER — Encounter: Payer: Self-pay | Admitting: Internal Medicine

## 2014-09-16 VITALS — BP 118/74 | HR 97 | Ht 70.0 in | Wt 242.2 lb

## 2014-09-16 DIAGNOSIS — G4733 Obstructive sleep apnea (adult) (pediatric): Secondary | ICD-10-CM

## 2014-09-16 DIAGNOSIS — J309 Allergic rhinitis, unspecified: Secondary | ICD-10-CM

## 2014-09-16 DIAGNOSIS — J4520 Mild intermittent asthma, uncomplicated: Secondary | ICD-10-CM

## 2014-09-16 DIAGNOSIS — J302 Other seasonal allergic rhinitis: Secondary | ICD-10-CM

## 2014-09-16 DIAGNOSIS — J3089 Other allergic rhinitis: Secondary | ICD-10-CM

## 2014-09-16 MED ORDER — METHYLPREDNISOLONE 8 MG PO TABS: ORAL_TABLET | ORAL | Status: DC

## 2014-09-16 MED ORDER — ALBUTEROL SULFATE HFA 108 (90 BASE) MCG/ACT IN AERS: 2.0000 | INHALATION_SPRAY | Freq: Four times a day (QID) | RESPIRATORY_TRACT | Status: DC | PRN

## 2014-09-16 MED ORDER — FLUTICASONE-SALMETEROL 100-50 MCG/DOSE IN AEPB: 1.0000 | INHALATION_SPRAY | Freq: Two times a day (BID) | RESPIRATORY_TRACT | Status: DC

## 2014-09-16 MED ORDER — ZAFIRLUKAST 20 MG PO TABS: 20.0000 mg | ORAL_TABLET | Freq: Every day | ORAL | Status: DC

## 2014-09-16 NOTE — Patient Instructions (Signed)
We can continue CPAP 10/ [facility]  Meds for asthma refilled and sent  Please call as needed

## 2014-09-16 NOTE — Progress Notes (Signed)
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 He has not had significant problems with asthma since last here. He likes to keep a Medrol taper available but has not used it. He continues regular use of Accolate and Advair without need for his rescue inhaler. He denies significant nasal congestion sneezing stuffiness or drainage. He continues to use CPAP all night every night at 10 CWP but he is interested in alternatives because of his travel. We discussed Provent.   10/ 21/ 13- 60 yoM never smoker followed for asthma, allergic rhinitis, complicated by OSA/CPAP Pt states no changes since last visit- CPAP 10/ [facility]. He likes CPAP, using it every night and taking with him when he travels. Asthma has been much better controlled in recent years. Because he travels a lot, he likes to keep a prescription for Medrol but he did not use of last year. He has dropped off of Accolate but continues Advair 100. Rare use of his albuterol rescue inhaler  09/14/13- 62 yoM never smoker followed for asthma, allergic rhinitis, complicated by OSA/CPAP(Dr Turner) Follows for- annual exam.  Pt has no complaints with his breathing at his time. CPAP 10/ [facility]- [facility] [place] Desanctisurner/ [facility] Says his breathing is very good.he continues Advair and Accolate once daily. Rare need for rescue inhaler. Because he travels a lot he still wants to keep a prescription for a Medrol taper although he has not used it in a few years.  09/16/14- 62 yoM never smoker followed for asthma, allergic rhinitis, complicated by OSA/CPAP(Dr Turner) FOLLOWS FOR: wears CPAP 10 every night-will need order [facility] supplies and mask [facility](can pick up next week). DME is [facility]. Had uncomplicated surgery for torn bicep-no respiratory problems during anesthesia. Asthma control good with Advair twice  daily, never seems to need rescue inhaler. Denies nasal congestion or sneezing now, in late fall  ROS-see HPI Constitutional:   No-   weight loss, night sweats, fevers, chills, fatigue, lassitude. HEENT:   No-  headaches, difficulty swallowing, tooth/dental problems, sore throat,       No-  sneezing, itching, ear ache, nasal congestion, post nasal drip,  CV:  No-   chest pain, orthopnea, PND, swelling in lower extremities, anasarca, dizziness, palpitations Resp: No-   shortness of breath with exertion or at rest.              No-   productive cough,  No non-productive cough,  No- coughing up of blood.              No-   change in color of mucus.  No- wheezing.   Skin: No-   rash or lesions. GI:  No-   heartburn, indigestion, abdominal pain, nausea, vomiting,  GU:  MS:  No-   joint pain or swelling.   Neuro-     nothing unusual Psych:  No- change in mood or affect. No depression or anxiety.  No memory loss.  OBJ General- Alert, Oriented, Affect-appropriate, Distress- none acute. + Weight gain Skin- rash-none, lesions- none, excoriation- none Lymphadenopathy- none Head- atraumatic            Eyes- Gross vision intact, PERRLA, conjunctivae clear                   secretions            Ears- Hearing, canals-normal  Nose- Clear, no-Septal dev, mucus, polyps, erosion,                       perforation             Throat- Mallampati II-III , mucosa clear , drainage- none,              tonsils- atrophic Neck- flexible , trachea midline, no stridor , thyroid nl, carotid no bruit Chest - symmetrical excursion , unlabored           Heart/CV- RRR , no murmur , no gallop  , no rub, nl s1 s2                           - JVD- none , edema- none, stasis changes- none,                           varices- none           Lung- clear to P&A, wheeze- none, cough- none ,                dullness-none, rub- none           Chest wall-  Abd-  Br/ Gen/ Rectal- Not done, not indicated Extrem-  cyanosis- none, clubbing, none, atrophy- none, strength- nl Neuro- grossly intact to observation

## 2014-09-21 ENCOUNTER — Other Ambulatory Visit: Payer: Self-pay | Admitting: Internal Medicine

## 2014-09-29 NOTE — Assessment & Plan Note (Signed)
Better control recently. No problems now in late fall

## 2014-09-29 NOTE — Assessment & Plan Note (Signed)
Good compliance and control at 10 CWP. Spoke about weight gain. Plan-seek download for compliance and pressure

## 2014-09-29 NOTE — Assessment & Plan Note (Signed)
Generally good control with Advair alone but likes to keep Accolate prednisone taper and rescue inhaler available. Medications were discussed

## 2014-10-09 ENCOUNTER — Other Ambulatory Visit: Payer: Self-pay

## 2014-10-09 DIAGNOSIS — G4733 Obstructive sleep apnea (adult) (pediatric): Secondary | ICD-10-CM

## 2014-12-12 ENCOUNTER — Other Ambulatory Visit: Payer: Self-pay | Admitting: Internal Medicine

## 2014-12-25 IMAGING — CR DG ELBOW COMPLETE 3+V*R*
4 series · 4 of 4 positions shown · non-contrast
Comparison: None.

CLINICAL DATA: Fell while bending over piece of furniture, trying
to open a window. Felt something in right arm "crackle and pop".
Diffuse right elbow pain, extending into the forearm and humerus.
Initial encounter.

EXAM:
RIGHT ELBOW - COMPLETE 3+ VIEW

[x elbow ap right]
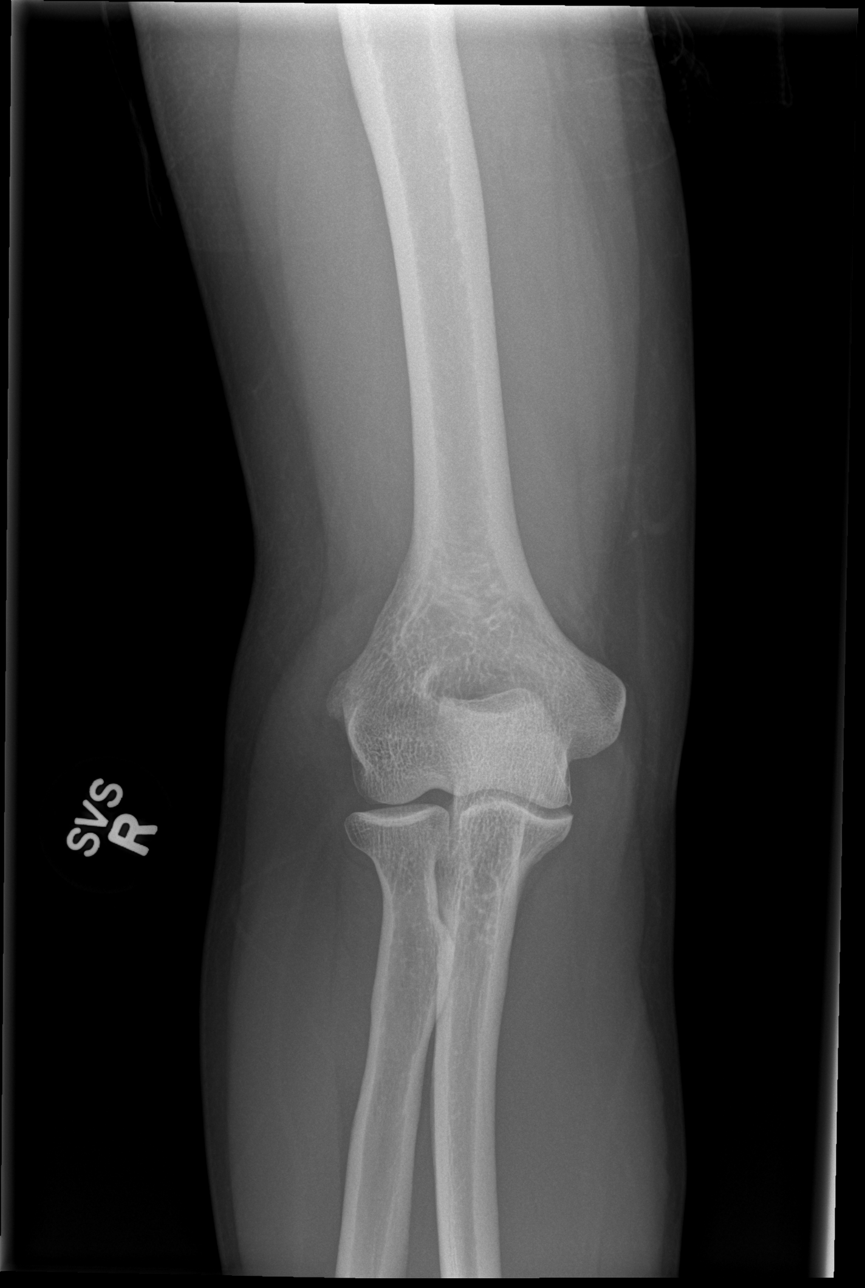

[x elbow obl right (1 of 2)]
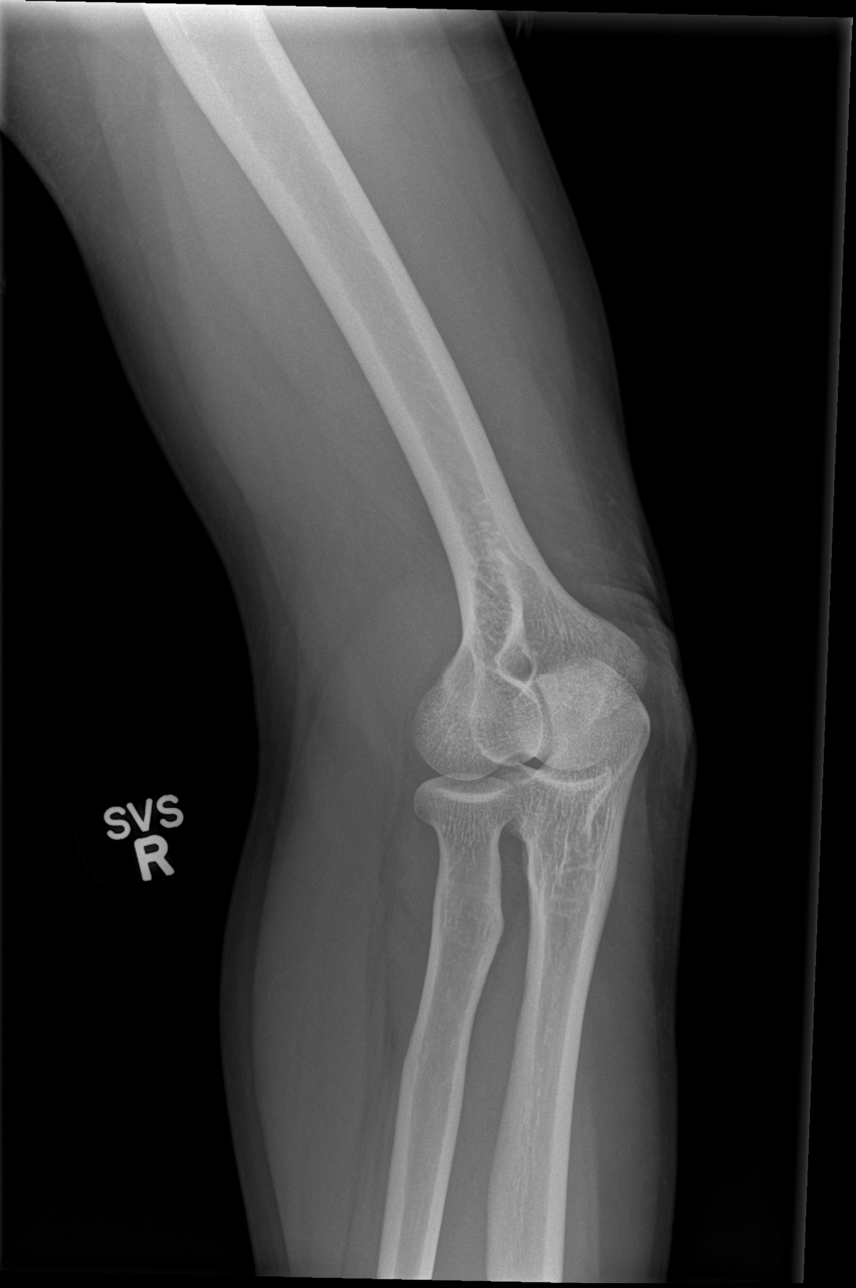

[x elbow obl right (2 of 2)]
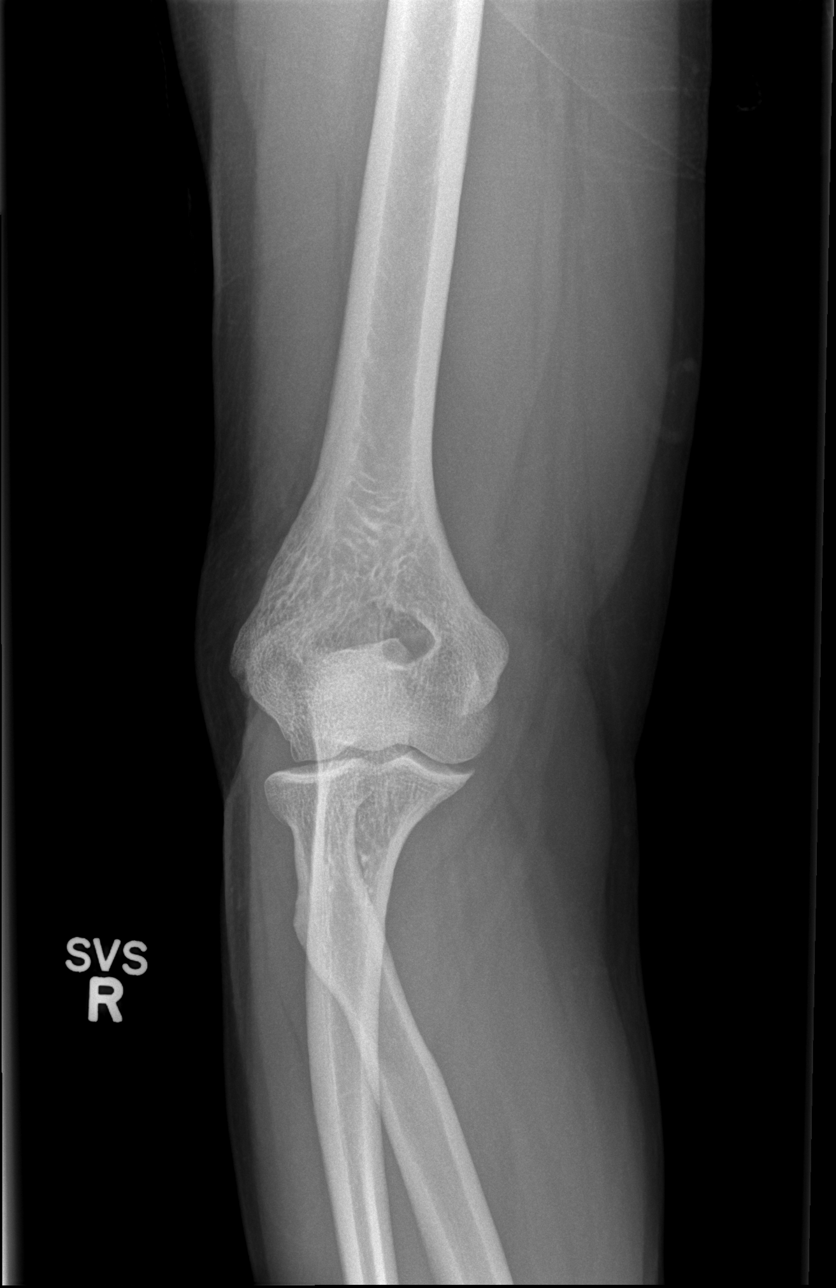

[x elbow lat right]
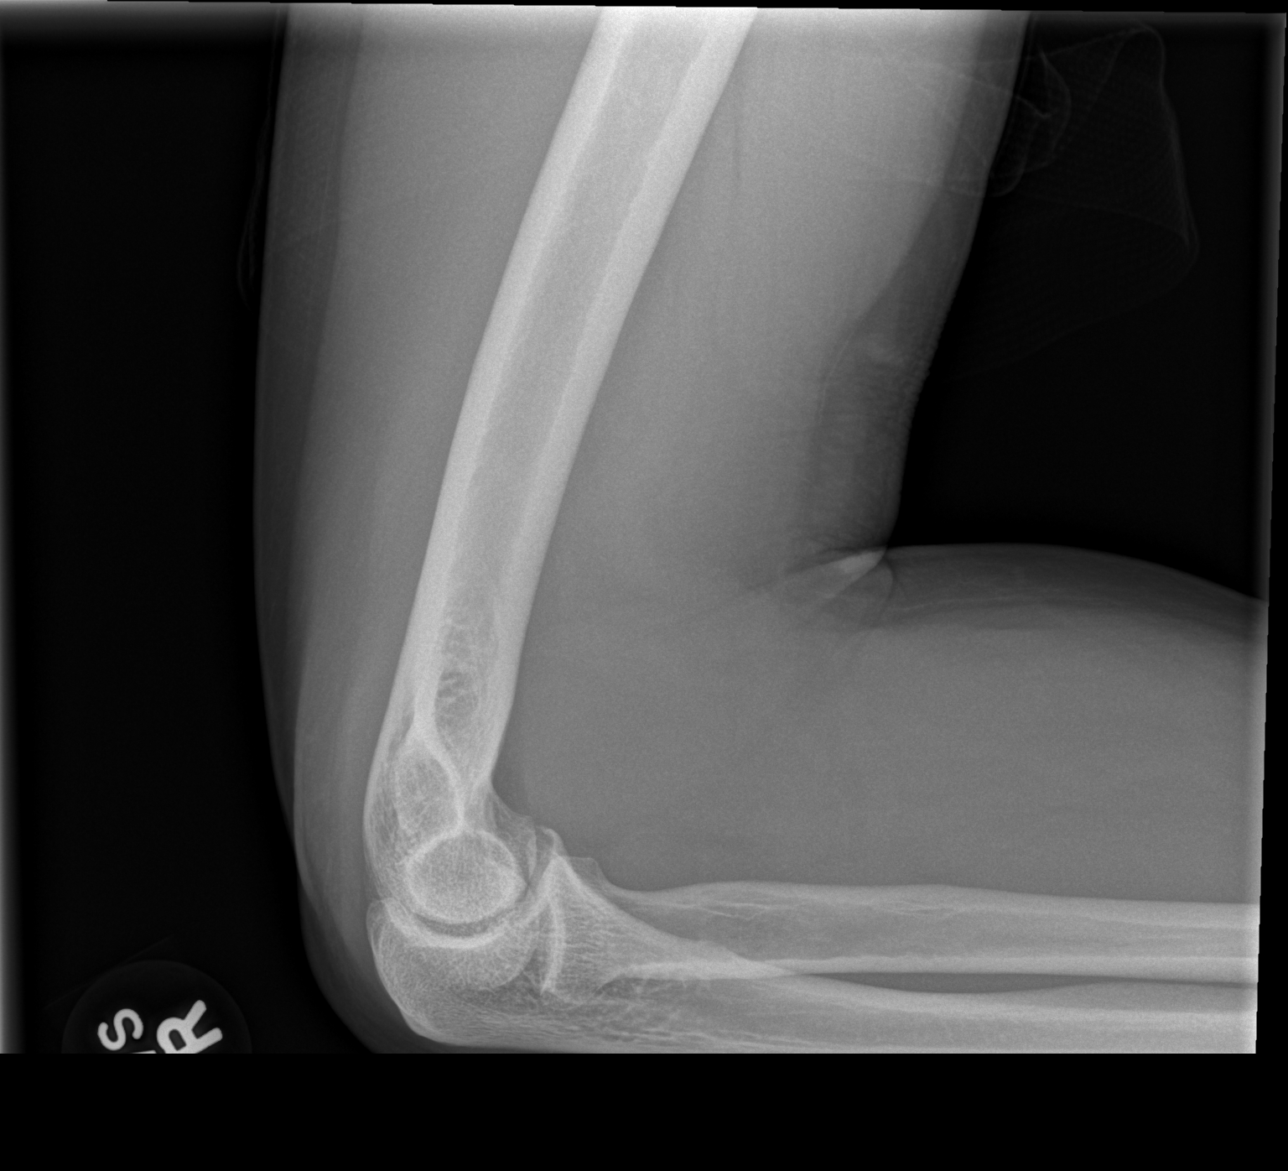

[4 of 4 positions shown; findings below may reference images not displayed]

FINDINGS: There is no evidence of fracture or dislocation. The visualized
joint spaces are preserved. No significant joint effusion is
identified. The soft tissues are unremarkable in appearance.
IMPRESSION: No evidence of fracture or dislocation.

## 2015-09-22 ENCOUNTER — Ambulatory Visit (INDEPENDENT_AMBULATORY_CARE_PROVIDER_SITE_OTHER): Payer: BLUE CROSS/BLUE SHIELD | Admitting: Internal Medicine

## 2015-09-22 ENCOUNTER — Encounter: Payer: Self-pay | Admitting: Internal Medicine

## 2015-09-22 VITALS — BP 120/74 | HR 87 | Ht 70.0 in | Wt 239.4 lb

## 2015-09-22 DIAGNOSIS — G4733 Obstructive sleep apnea (adult) (pediatric): Secondary | ICD-10-CM | POA: Diagnosis not present

## 2015-09-22 DIAGNOSIS — I10 Essential (primary) hypertension: Secondary | ICD-10-CM

## 2015-09-22 DIAGNOSIS — Z23 Encounter for immunization: Secondary | ICD-10-CM | POA: Diagnosis not present

## 2015-09-22 DIAGNOSIS — J4520 Mild intermittent asthma, uncomplicated: Secondary | ICD-10-CM

## 2015-09-22 MED ORDER — FEXOFENADINE HCL 60 MG PO TABS: 60.0000 mg | ORAL_TABLET | Freq: Every day | ORAL | Status: DC

## 2015-09-22 MED ORDER — ZAFIRLUKAST 20 MG PO TABS: ORAL_TABLET | ORAL | Status: DC

## 2015-09-22 MED ORDER — FLUTICASONE-SALMETEROL 100-50 MCG/DOSE IN AEPB: INHALATION_SPRAY | RESPIRATORY_TRACT | Status: DC

## 2015-09-22 MED ORDER — METHYLPREDNISOLONE 8 MG PO TABS: ORAL_TABLET | ORAL | Status: DC

## 2015-09-22 MED ORDER — ALBUTEROL SULFATE HFA 108 (90 BASE) MCG/ACT IN AERS: 2.0000 | INHALATION_SPRAY | Freq: Four times a day (QID) | RESPIRATORY_TRACT | Status: DC | PRN

## 2015-09-22 NOTE — Patient Instructions (Signed)
Med refills sent to [facility]  Flu vax  We will manage CPAP from this office since you are not seeing Dr Mayford Knifeurner routinely  Order DME [facility]- continue CPAP 10, mask of choice, supplies, humidifier, add Air View     Dx OSA

## 2015-09-22 NOTE — Progress Notes (Signed)
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 He has not had significant problems with asthma since last here. He likes to keep a Medrol taper available but has not used it. He continues regular use of Accolate and Advair without need for his rescue inhaler. He denies significant nasal congestion sneezing stuffiness or drainage. He continues to use CPAP all night every night at 10 CWP but he is interested in alternatives because of his travel. We discussed Provent.   10/ 21/ 13- 64 yoM never smoker followed for asthma, allergic rhinitis, complicated by OSA/CPAP Pt states no changes since last visit- CPAP 10/ [facility]. He likes CPAP, using it every night and taking with him when he travels. Asthma has been much better controlled in recent years. Because he travels a lot, he likes to keep a prescription for Medrol but he did not use of last year. He has dropped off of Accolate but continues Advair 100. Rare use of his albuterol rescue inhaler  09/14/13- 64 yoM never smoker followed for asthma, allergic rhinitis, complicated by OSA/CPAP(Dr Turner) Follows for- annual exam.  Pt has no complaints with his breathing at his time. CPAP 10/ [facility]- Dr [place] Desanctis Says his breathing is very good.he continues Advair and Accolate once daily. Rare need for rescue inhaler. Because he travels a lot he still wants to keep a prescription for a Medrol taper although he has not used it in a few years.  09/16/14- 64 yoM never smoker followed for asthma, allergic rhinitis, complicated by OSA/CPAP(Dr Turner) FOLLOWS FOR: wears CPAP 10 every night-will need order for supplies and mask through [facility] sent(can pick up next week). DME is [facility]. Had uncomplicated surgery for torn bicep-no respiratory problems during anesthesia. Asthma control good with Advair twice  daily, never seems to need rescue inhaler. Denies nasal congestion or sneezing now, in late fall  09/22/15- 64 year old male never smoker followed for asthma, allergic rhinitis, complicated by OSA/CPAP  Follows For: pt states he is doing great. pt using CPAP every night for about 8 hours. mask and pressure good for pt. no download avilable. no concerns at this time. DME: [facility] CPAP 10/[facility] Denies problems with CPAP used all night every night. Sleeping well. He asks that we take over management because he no longer sees his cardiologist. His primary physician is managing his associated medications. Has not needed rescue inhaler recently. Still uses Advair and Accolate.  ROS-see HPI Constitutional:   No-   weight loss, night sweats, fevers, chills, fatigue, lassitude. HEENT:   No-  headaches, difficulty swallowing, tooth/dental problems, sore throat,       No-  sneezing, itching, ear ache, nasal congestion, post nasal drip,  CV:  No-   chest pain, orthopnea, PND, swelling in lower extremities, anasarca, dizziness, palpitations Resp: No-   shortness of breath with exertion or at rest.              No-   productive cough,  No non-productive cough,  No- coughing up of blood.              No-   change in color of mucus.  No- wheezing.   Skin: No-   rash or lesions. GI:  No-   heartburn, indigestion, abdominal pain, nausea, vomiting,  GU:  MS:  No-   joint pain or swelling.   Neuro-  nothing unusual Psych:  No- change in mood or affect. No depression or anxiety.  No memory loss.  OBJ General- Alert, Oriented, Affect-appropriate, Distress- none acute. + Overweight Skin- rash-none, lesions- none, excoriation- none Lymphadenopathy- none Head- atraumatic            Eyes- Gross vision intact, PERRLA, conjunctivae clear secretions            Ears- Hearing, canals-normal            Nose- Clear, no-Septal dev, mucus, polyps, erosion, perforation             Throat- Mallampati II-III , mucosa  clear , drainage- none, tonsils- atrophic Neck- flexible , trachea midline, no stridor , thyroid nl, carotid no bruit Chest - symmetrical excursion , unlabored           Heart/CV- RRR , no murmur , no gallop  , no rub, nl s1 s2                           - JVD- none , edema- none, stasis changes- none,  varices- none           Lung- clear to P&A, wheeze- none, cough- none , dullness-none, rub- none           Chest wall-  Abd-  Br/ Gen/ Rectal- Not done, not indicated Extrem- cyanosis- none, clubbing, none, atrophy- none, strength- nl Neuro- grossly intact to observation

## 2015-09-23 NOTE — Assessment & Plan Note (Signed)
Well-controlled without recent need for rescue inhaler, without sleep disturbance, without acute exacerbation. Plan-flu vaccine

## 2015-09-23 NOTE — Assessment & Plan Note (Signed)
He asks that we manage his CPAP. Pressure is comfortable and compliance described as good. He has no concerns. Advised weight loss.

## 2015-09-23 NOTE — Assessment & Plan Note (Signed)
Managed now by his primary physician

## 2015-10-16 ENCOUNTER — Other Ambulatory Visit: Payer: Self-pay | Admitting: Internal Medicine

## 2016-09-25 ENCOUNTER — Other Ambulatory Visit: Payer: Self-pay | Admitting: Internal Medicine

## 2016-09-27 ENCOUNTER — Encounter: Payer: Self-pay | Admitting: Internal Medicine

## 2016-09-27 ENCOUNTER — Ambulatory Visit (INDEPENDENT_AMBULATORY_CARE_PROVIDER_SITE_OTHER): Payer: BLUE CROSS/BLUE SHIELD | Admitting: Internal Medicine

## 2016-09-27 VITALS — BP 128/78 | HR 90 | Ht 70.0 in | Wt 246.6 lb

## 2016-09-27 DIAGNOSIS — G4733 Obstructive sleep apnea (adult) (pediatric): Secondary | ICD-10-CM

## 2016-09-27 DIAGNOSIS — J4520 Mild intermittent asthma, uncomplicated: Secondary | ICD-10-CM

## 2016-09-27 MED ORDER — PREDNISONE 10 MG PO TABS: ORAL_TABLET | ORAL | 0 refills | Status: DC

## 2016-09-27 MED ORDER — ZAFIRLUKAST 20 MG PO TABS: ORAL_TABLET | ORAL | 11 refills | Status: DC

## 2016-09-27 MED ORDER — ALBUTEROL SULFATE HFA 108 (90 BASE) MCG/ACT IN AERS: 2.0000 | INHALATION_SPRAY | Freq: Four times a day (QID) | RESPIRATORY_TRACT | 99 refills | Status: AC | PRN

## 2016-09-27 MED ORDER — FLUTICASONE-SALMETEROL 100-50 MCG/DOSE IN AEPB: INHALATION_SPRAY | RESPIRATORY_TRACT | 12 refills | Status: DC

## 2016-09-27 NOTE — Progress Notes (Signed)
HPI              [age] yoM never smoker followed for asthma, allergic rhinitis, complicated by OSA/CPAP     09/22/15- 65 year old male never smoker followed for asthma, allergic rhinitis, complicated by OSA/CPAP  Follows For: pt states he is doing great. pt using CPAP every night for about 8 hours. mask and pressure good for pt. no download avilable. no concerns at this time. DME: [facility] CPAP 10/[facility] Denies problems with CPAP used all night every night. Sleeping well. He asks that we take over management because he no longer sees his cardiologist. His primary physician is managing his associated medications. Has not needed rescue inhaler recently. Still uses Advair and Accolate.  09/27/2016-65 year old male never smoker followed for Asthma, allergic rhinitis,  OSA/CPAP CPAP 10/[facility] FOLLOWS FOR: DME [facility] Pt states he wears CPAP every night for at least 7-8 hours;pt did not bring SD Card-will need to order DL and enroll in Airview. No new supplies needed at this time.  Very comfortable with CPAP, sleeps better and denies snoring or significant daytime sleepiness. Machine is 812 or 63 years old. Needs refill asthma medicines with no significant exacerbation. Rare use rescue inhaler.  ROS-see HPI Constitutional:   No-   weight loss, night sweats, fevers, chills, fatigue, lassitude. HEENT:   No-  headaches, difficulty swallowing, tooth/dental problems, sore throat,       No-  sneezing, itching, ear ache, nasal congestion, post nasal drip,  CV:  No-   chest pain, orthopnea, PND, swelling in lower extremities, anasarca, dizziness, palpitations Resp: No-   shortness of breath with exertion or at rest.              No-   productive cough,  No non-productive cough,  No- coughing up of blood.              No-   change in color of mucus.  No- wheezing.   Skin: No-   rash or lesions. GI:  No-   heartburn, indigestion, abdominal pain, nausea, vomiting,  GU:  MS:  No-   joint pain or swelling.   Neuro-      nothing unusual Psych:  No- change in mood or affect. No depression or anxiety.  No memory loss.  OBJ General- Alert, Oriented, Affect-appropriate, Distress- none acute. + Overweight Skin- rash-none, lesions- none, excoriation- none Lymphadenopathy- none Head- atraumatic            Eyes- Gross vision intact, PERRLA, conjunctivae clear secretions            Ears- Hearing, canals-normal            Nose- Clear, no-Septal dev, mucus, polyps, erosion, perforation             Throat- Mallampati II-III , mucosa clear , drainage- none, tonsils- atrophic Neck- flexible , trachea midline, no stridor , thyroid nl, carotid no bruit Chest - symmetrical excursion , unlabored           Heart/CV- RRR , no murmur , no gallop  , no rub, nl s1 s2                           - JVD- none , edema- none, stasis changes- none,  varices- none           Lung- clear to P&A, wheeze- none, cough- none , dullness-none, rub- none  Chest wall-  Abd-  Br/ Gen/ Rectal- Not done, not indicated Extrem- cyanosis- none, clubbing, none, atrophy- none, strength- nl Neuro- grossly intact to observation

## 2016-09-27 NOTE — Patient Instructions (Addendum)
Order- Office spirometry   Dx asthma mild intermittent  Order- DME [facility]  Continue CPAP 6, mask of choice, humidifier, supplies. Please send pressure compliance download. Please add SD card or AirView if available    Dx OSA  Med refills sent  Please call if we can help

## 2016-09-27 NOTE — Assessment & Plan Note (Signed)
He describes very good compliance and control with no changes required. He did not bring in card for download today. We will request download.

## 2016-09-27 NOTE — Assessment & Plan Note (Signed)
Mild intermittent well-controlled. He again requests to hold a prescription for prednisone but has rarely needed it. Routine meds appropriate and discussed.

## 2017-07-06 DIAGNOSIS — R739 Hyperglycemia, unspecified: Secondary | ICD-10-CM | POA: Diagnosis not present

## 2017-10-05 ENCOUNTER — Other Ambulatory Visit: Payer: Self-pay | Admitting: Internal Medicine

## 2017-10-08 ENCOUNTER — Other Ambulatory Visit: Payer: Self-pay | Admitting: Internal Medicine

## 2017-11-10 ENCOUNTER — Other Ambulatory Visit: Payer: Self-pay | Admitting: Internal Medicine

## 2017-11-24 ENCOUNTER — Other Ambulatory Visit: Payer: Self-pay | Admitting: Internal Medicine

## 2017-12-01 DIAGNOSIS — I10 Essential (primary) hypertension: Secondary | ICD-10-CM | POA: Diagnosis not present

## 2017-12-01 DIAGNOSIS — J45909 Unspecified asthma, uncomplicated: Secondary | ICD-10-CM | POA: Diagnosis not present

## 2017-12-01 DIAGNOSIS — F324 Major depressive disorder, single episode, in partial remission: Secondary | ICD-10-CM | POA: Diagnosis not present

## 2017-12-01 DIAGNOSIS — E291 Testicular hypofunction: Secondary | ICD-10-CM | POA: Diagnosis not present

## 2017-12-01 DIAGNOSIS — R7303 Prediabetes: Secondary | ICD-10-CM | POA: Diagnosis not present

## 2018-04-17 DIAGNOSIS — N401 Enlarged prostate with lower urinary tract symptoms: Secondary | ICD-10-CM | POA: Diagnosis not present

## 2018-04-17 DIAGNOSIS — N281 Cyst of kidney, acquired: Secondary | ICD-10-CM | POA: Diagnosis not present

## 2018-04-17 DIAGNOSIS — N289 Disorder of kidney and ureter, unspecified: Secondary | ICD-10-CM | POA: Diagnosis not present

## 2018-04-17 DIAGNOSIS — E291 Testicular hypofunction: Secondary | ICD-10-CM | POA: Diagnosis not present

## 2018-04-17 DIAGNOSIS — R82994 Hypercalciuria: Secondary | ICD-10-CM | POA: Diagnosis not present

## 2018-04-17 DIAGNOSIS — N200 Calculus of kidney: Secondary | ICD-10-CM | POA: Diagnosis not present

## 2018-07-10 DIAGNOSIS — I10 Essential (primary) hypertension: Secondary | ICD-10-CM | POA: Diagnosis not present

## 2018-07-10 DIAGNOSIS — Z125 Encounter for screening for malignant neoplasm of prostate: Secondary | ICD-10-CM | POA: Diagnosis not present

## 2018-07-10 DIAGNOSIS — E291 Testicular hypofunction: Secondary | ICD-10-CM | POA: Diagnosis not present

## 2018-07-10 DIAGNOSIS — G43909 Migraine, unspecified, not intractable, without status migrainosus: Secondary | ICD-10-CM | POA: Diagnosis not present

## 2018-07-10 DIAGNOSIS — G479 Sleep disorder, unspecified: Secondary | ICD-10-CM | POA: Diagnosis not present

## 2018-07-10 DIAGNOSIS — E7800 Pure hypercholesterolemia, unspecified: Secondary | ICD-10-CM | POA: Diagnosis not present

## 2018-07-10 DIAGNOSIS — G2581 Restless legs syndrome: Secondary | ICD-10-CM | POA: Diagnosis not present

## 2018-08-17 DIAGNOSIS — G4733 Obstructive sleep apnea (adult) (pediatric): Secondary | ICD-10-CM | POA: Diagnosis not present

## 2018-11-13 DIAGNOSIS — Z23 Encounter for immunization: Secondary | ICD-10-CM | POA: Diagnosis not present

## 2018-11-13 DIAGNOSIS — E291 Testicular hypofunction: Secondary | ICD-10-CM | POA: Diagnosis not present

## 2018-11-13 DIAGNOSIS — E7800 Pure hypercholesterolemia, unspecified: Secondary | ICD-10-CM | POA: Diagnosis not present

## 2018-11-13 DIAGNOSIS — I10 Essential (primary) hypertension: Secondary | ICD-10-CM | POA: Diagnosis not present

## 2018-11-13 DIAGNOSIS — R7303 Prediabetes: Secondary | ICD-10-CM | POA: Diagnosis not present

## 2018-11-13 DIAGNOSIS — Z0000 Encounter for general adult medical examination without abnormal findings: Secondary | ICD-10-CM | POA: Diagnosis not present

## 2019-04-16 DIAGNOSIS — N281 Cyst of kidney, acquired: Secondary | ICD-10-CM | POA: Diagnosis not present

## 2019-04-16 DIAGNOSIS — N138 Other obstructive and reflux uropathy: Secondary | ICD-10-CM | POA: Diagnosis not present

## 2019-04-16 DIAGNOSIS — N401 Enlarged prostate with lower urinary tract symptoms: Secondary | ICD-10-CM | POA: Diagnosis not present

## 2019-04-16 DIAGNOSIS — N200 Calculus of kidney: Secondary | ICD-10-CM | POA: Diagnosis not present

## 2019-05-30 DIAGNOSIS — E291 Testicular hypofunction: Secondary | ICD-10-CM | POA: Diagnosis not present

## 2019-05-30 DIAGNOSIS — L918 Other hypertrophic disorders of the skin: Secondary | ICD-10-CM | POA: Diagnosis not present

## 2019-05-30 DIAGNOSIS — Z125 Encounter for screening for malignant neoplasm of prostate: Secondary | ICD-10-CM | POA: Diagnosis not present

## 2019-05-30 DIAGNOSIS — I10 Essential (primary) hypertension: Secondary | ICD-10-CM | POA: Diagnosis not present

## 2019-05-30 DIAGNOSIS — R7303 Prediabetes: Secondary | ICD-10-CM | POA: Diagnosis not present

## 2019-08-15 DIAGNOSIS — G4733 Obstructive sleep apnea (adult) (pediatric): Secondary | ICD-10-CM | POA: Diagnosis not present

## 2021-07-02 ENCOUNTER — Other Ambulatory Visit: Payer: Self-pay | Admitting: Sports Medicine

## 2021-07-02 ENCOUNTER — Ambulatory Visit
Admission: RE | Admit: 2021-07-02 | Discharge: 2021-07-02 | Disposition: A | Discharge status: Home / Self Care | Payer: Medicare Other | Source: Ambulatory Visit | Attending: Sports Medicine | Admitting: Sports Medicine

## 2021-07-02 DIAGNOSIS — M25561 Pain in right knee: Secondary | ICD-10-CM

## 2021-11-18 IMAGING — DX DG KNEE 3 VIEWS*R*
3 series · 3 of 3 positions shown · non-contrast
Comparison: None.

CLINICAL DATA: Chronic right medial knee pain

EXAM:
RIGHT KNEE - 3 VIEW

[dg knee 3 views right (1 of 3)]
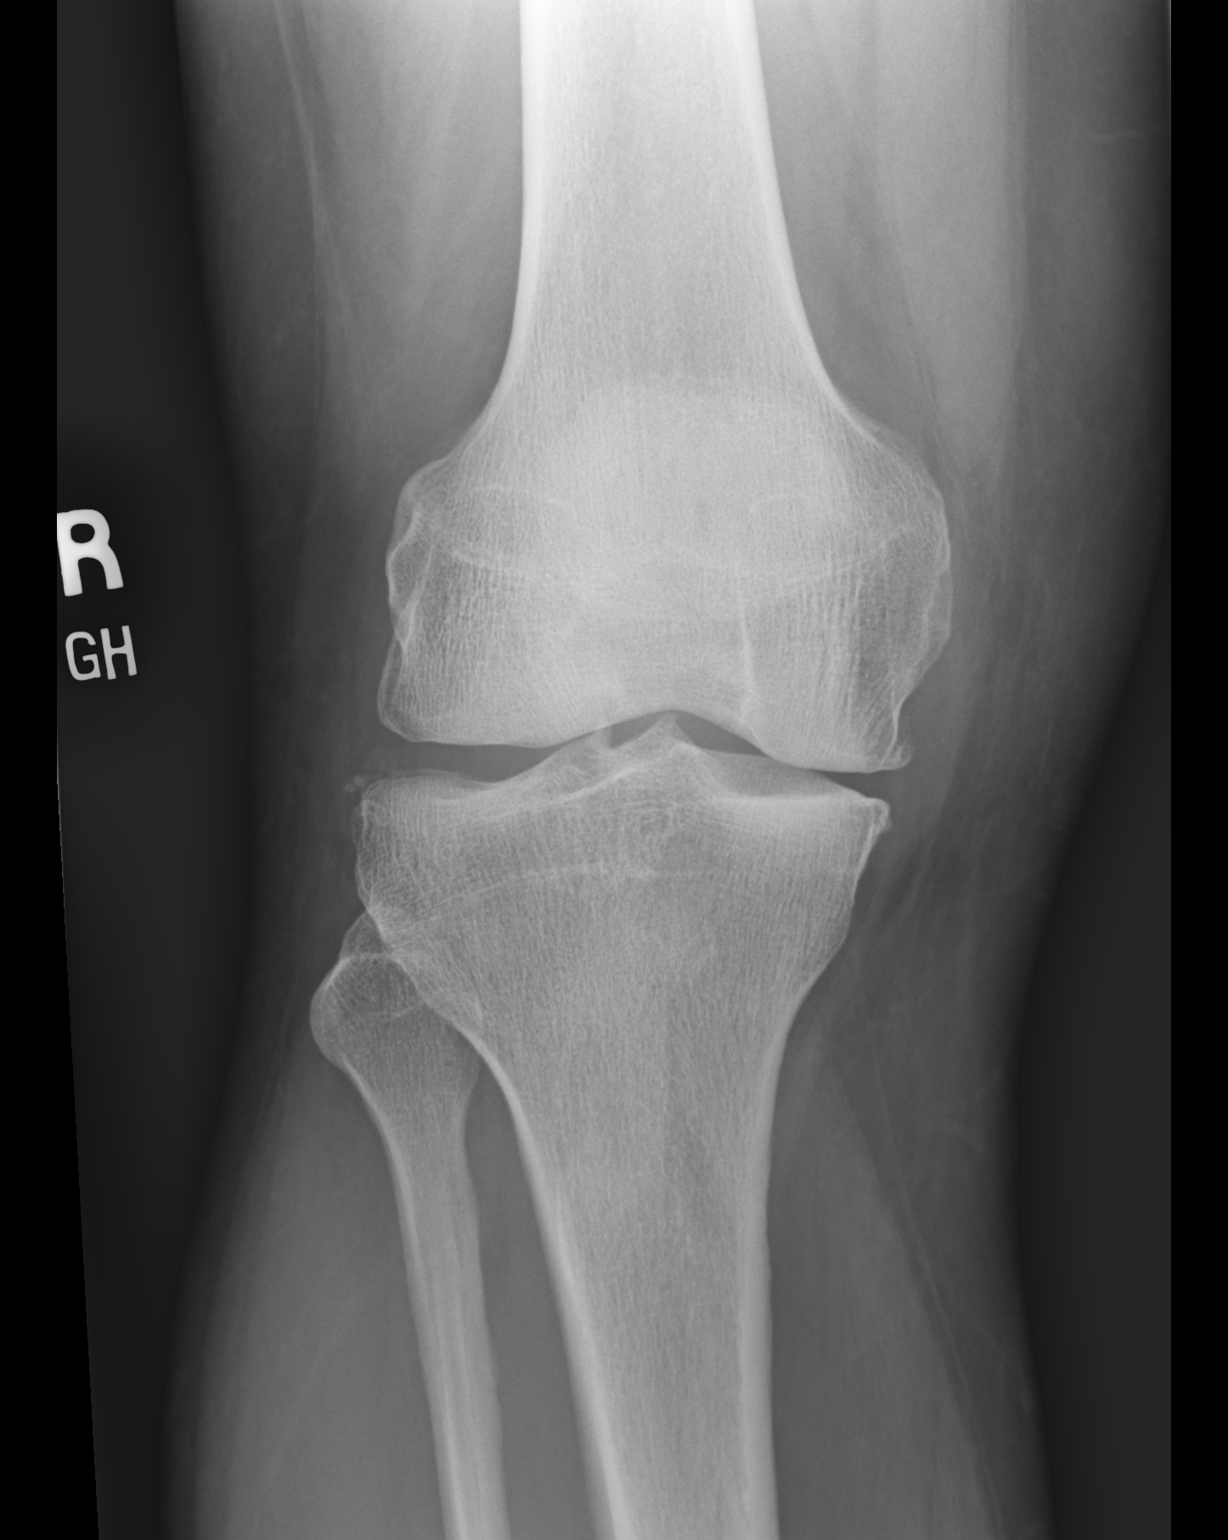

[dg knee 3 views right (2 of 3)]
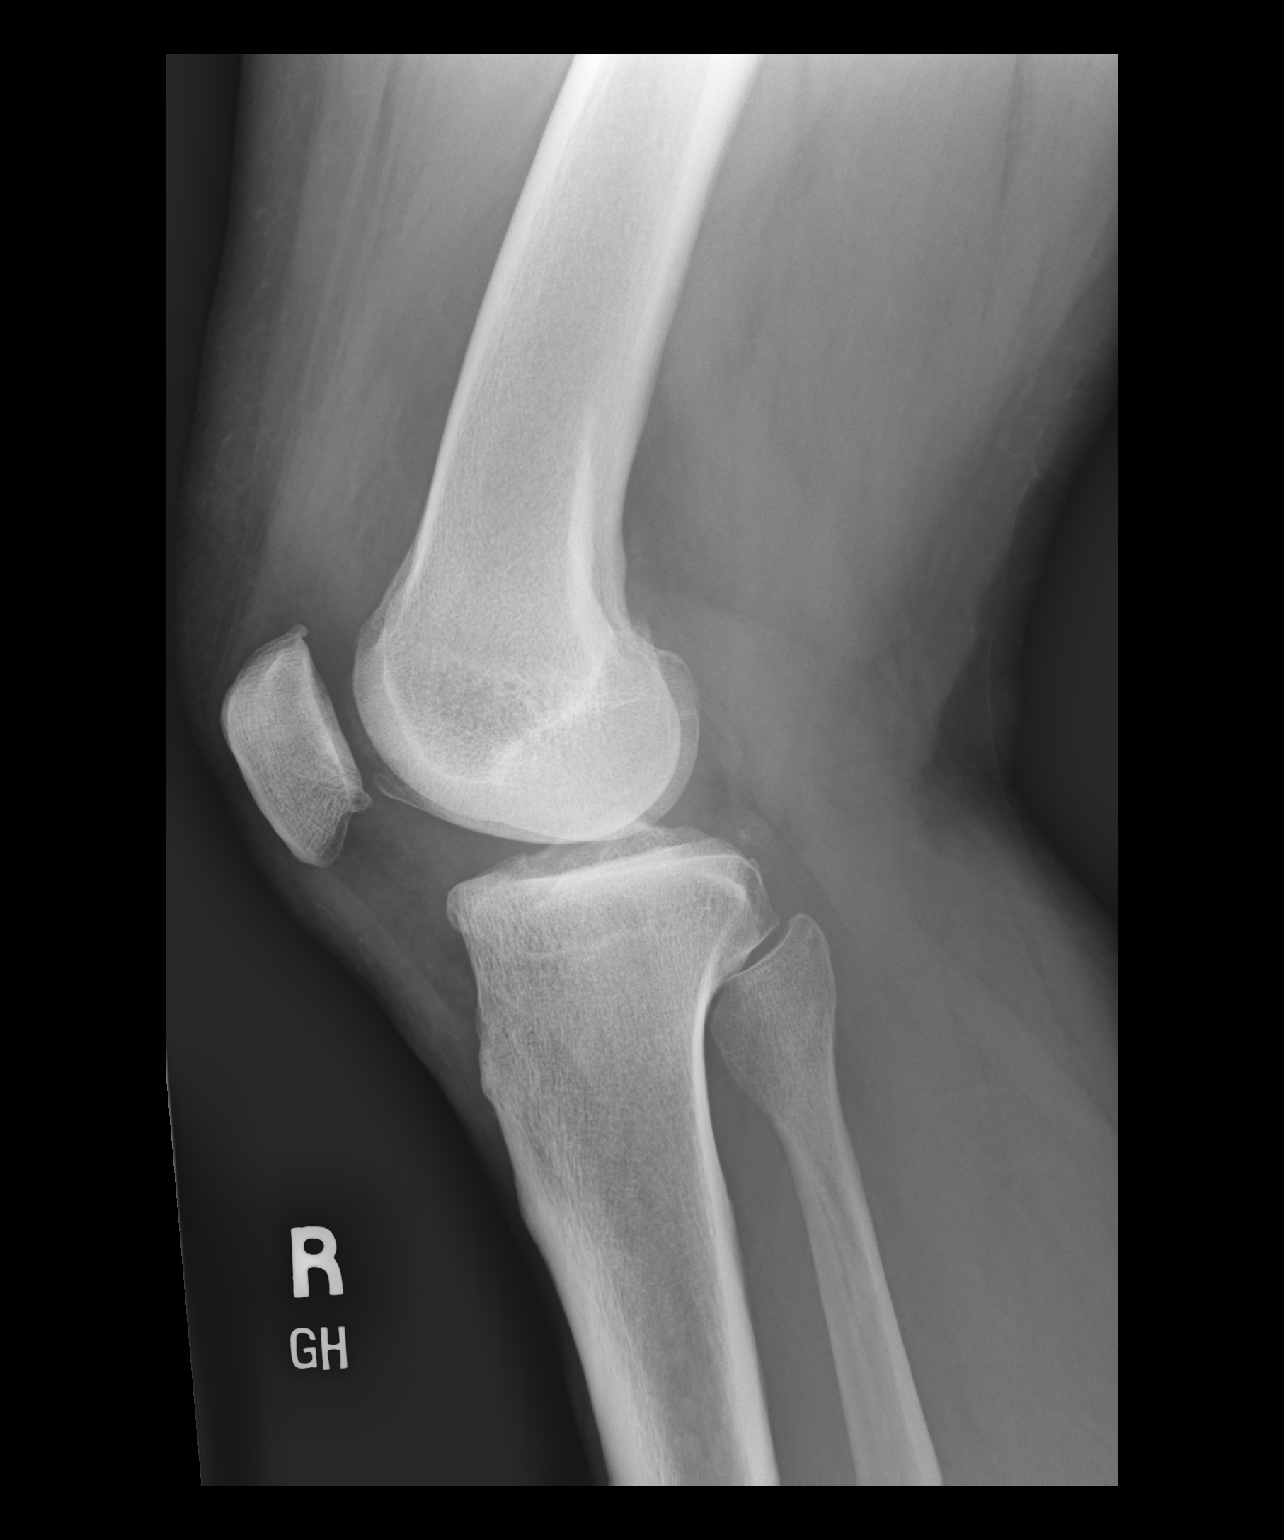

[dg knee 3 views right (3 of 3)]
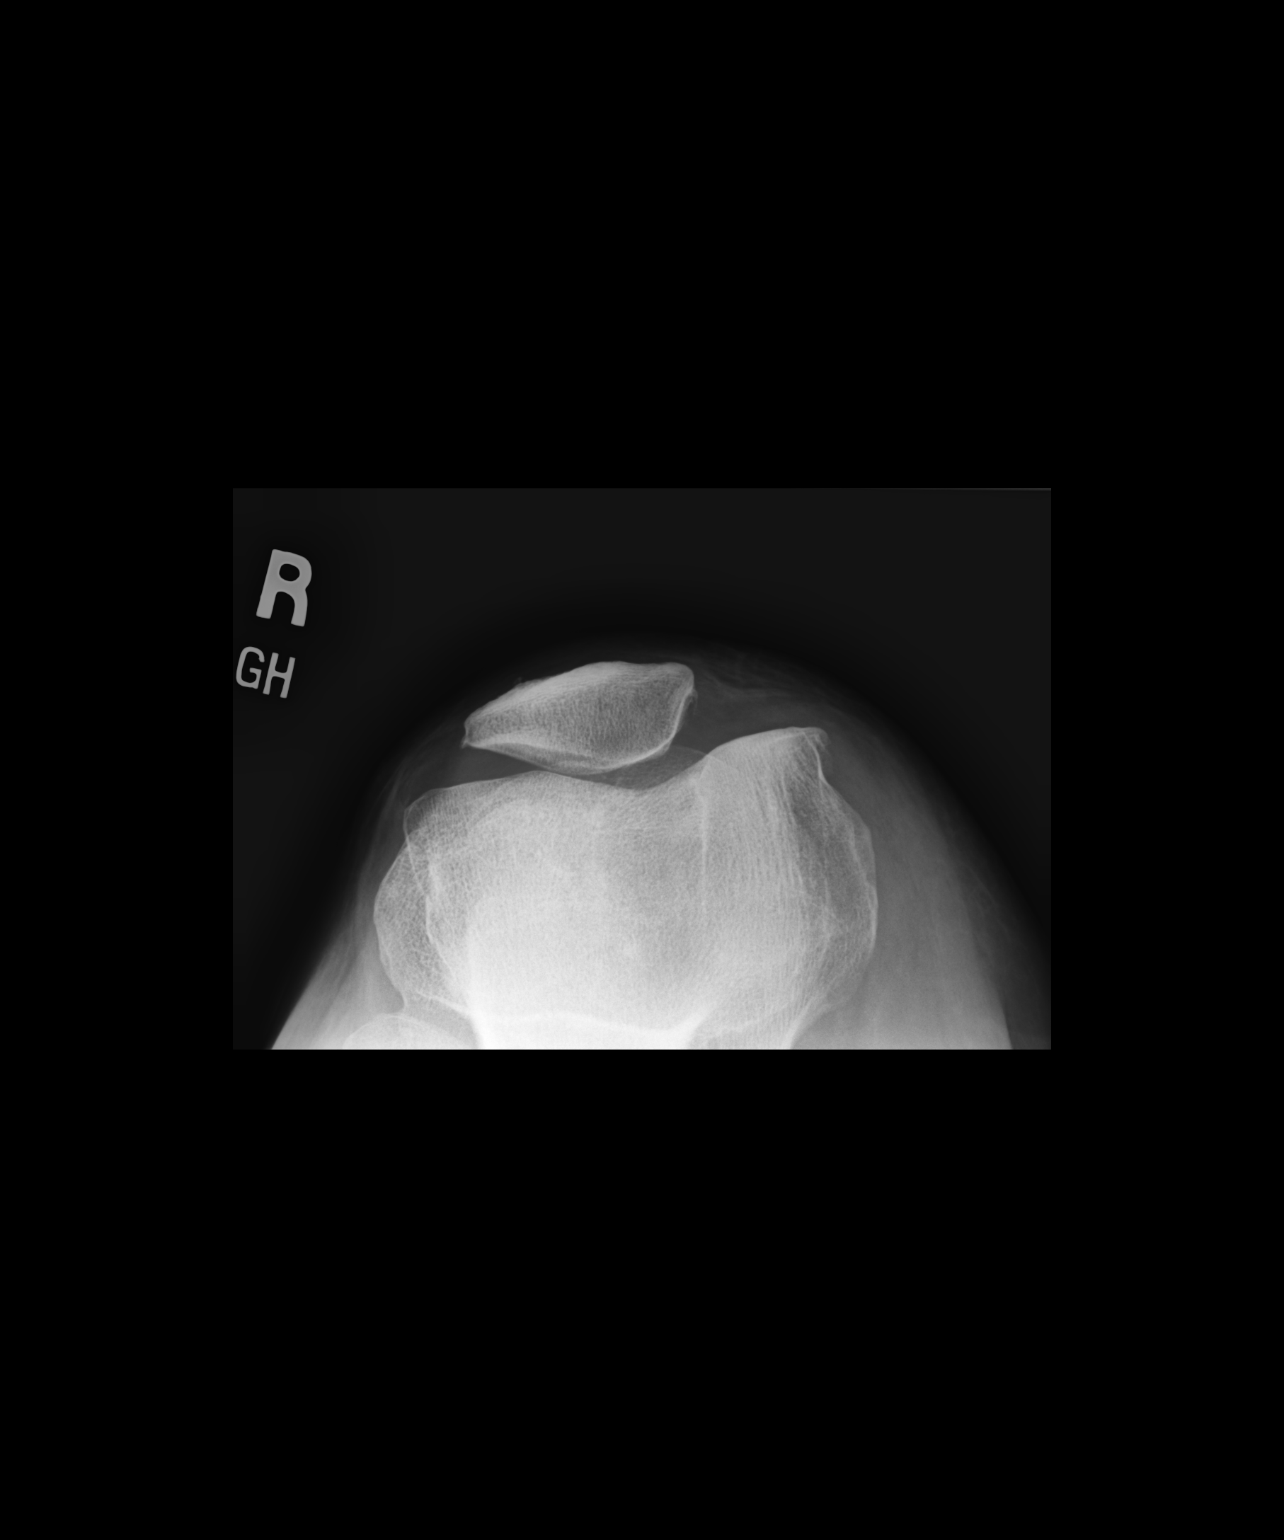

[3 of 3 positions shown; findings below may reference images not displayed]

FINDINGS: There is mild overall straightening of the a right knee. No acute
fracture or dislocation. Mild tricompartmental degenerative
arthritis, most severe within the medial compartment. Small right
knee effusion. Soft tissues are otherwise unremarkable.
IMPRESSION: Mild tricompartmental degenerative arthritis.  Small effusion.

## 2024-08-21 ENCOUNTER — Other Ambulatory Visit (HOSPITAL_COMMUNITY): Payer: Self-pay | Admitting: Family Medicine

## 2024-08-21 DIAGNOSIS — R2689 Other abnormalities of gait and mobility: Secondary | ICD-10-CM

## 2024-08-21 DIAGNOSIS — H539 Unspecified visual disturbance: Secondary | ICD-10-CM

## 2024-08-24 ENCOUNTER — Ambulatory Visit (HOSPITAL_COMMUNITY)
Admission: RE | Admit: 2024-08-24 | Discharge: 2024-08-24 | Disposition: A | Discharge status: Home / Self Care | Source: Ambulatory Visit | Attending: Family Medicine | Admitting: Family Medicine

## 2024-08-24 DIAGNOSIS — R2689 Other abnormalities of gait and mobility: Secondary | ICD-10-CM | POA: Insufficient documentation

## 2024-08-24 DIAGNOSIS — H539 Unspecified visual disturbance: Secondary | ICD-10-CM | POA: Diagnosis present

## 2024-08-28 ENCOUNTER — Encounter: Payer: Self-pay | Admitting: Neurology

## 2024-08-28 ENCOUNTER — Ambulatory Visit (INDEPENDENT_AMBULATORY_CARE_PROVIDER_SITE_OTHER): Admitting: Neurology

## 2024-08-28 VITALS — BP 132/80 | Ht 70.0 in | Wt 217.0 lb

## 2024-08-28 DIAGNOSIS — I6381 Other cerebral infarction due to occlusion or stenosis of small artery: Secondary | ICD-10-CM

## 2024-08-28 MED ORDER — CLOPIDOGREL BISULFATE 75 MG PO TABS: 75.0000 mg | ORAL_TABLET | Freq: Every day | ORAL | 0 refills | Status: DC

## 2024-08-28 MED ORDER — ASPIRIN 81 MG PO TBEC: 81.0000 mg | DELAYED_RELEASE_TABLET | Freq: Every day | ORAL | 4 refills | Status: AC

## 2024-08-28 MED ORDER — ROSUVASTATIN CALCIUM 40 MG PO TABS: 40.0000 mg | ORAL_TABLET | Freq: Every evening | ORAL | 3 refills | Status: AC

## 2024-08-28 NOTE — Patient Instructions (Signed)
 Start aspirin 81 mg daily Start Plavix 75 mg daily for 21 days then discontinued Start Crestor 40 mg nightly Will check lipid panel and BMP Will obtain a CT angiogram head and neck after completion of the BMP Will obtain echocardiogram and a 30-day cardiac monitor Advised patient to present to the ED for any new acute neurological changes Continue to follow with PCP Return if worse

## 2024-08-28 NOTE — Progress Notes (Signed)
 [facility]  PATIENT: [name] Brown DOB: [birth year]  REQUESTING CLINICIAN: Arloa Elsie SAUNDERS, MD HISTORY FROM: Patient/Spouse  REASON FOR VISIT: Visual changes/ Imbalance/ Strokes on MRI   HISTORICAL  CHIEF COMPLAINT:  Chief Complaint  Patient presents with   RM 12    Consult: balance concerns, vision changes; balance issues for ~6wks, vision in L eye gets shadowy; MRI and US  08/24/24; patient with wife    HISTORY OF PRESENT ILLNESS:  Discussed the use of AI scribe software for clinical note transcription with the patient, who gave verbal consent to proceed.  [name] Brown is a 73 year old male with a history of migraines, sleep apnea, hypertension, depression who presents with vision changes and balance issues, had a recent MRI showing recent strokes.  He experiences vision issues primarily in his left eye, where two to three weeks ago, he had a significant episode where his left side vision was completely obscured, described as 'like smoke' or 'a forest fire,' which has not recurred. He began experiencing visual disturbances described as 'auras' about four weeks ago, starting with an episode where he perceived the sink as being 'full of worms.'  In addition to vision changes, he has balance issues, feeling 'like a ship on the sea,' with a tendency to veer to one the left side. He mentions increased agitation and a decline in activity levels over the past two to three months. His wife notes he has become more disoriented, even in familiar places. No specific weakness, but he feels wobbly upon standing. His wife confirms changes in his behavior and orientation.  He has experienced an increase in headaches over the past six to eight weeks. He underwent an MRI showing acute infarct in the left subinsular region and a small subacute infarct in the right basal ganglia.  There was also presence of mild generalized atrophy.  Results were discussed during this  visit.  He is currently not taking aspirin or statin. He also uses a CPAP machine for sleep apnea.    OTHER MEDICAL CONDITIONS: Migraines, Hypertension, sleep apnea   REVIEW OF SYSTEMS: Full 14 system review of systems performed and negative with exception of: As noted in the HPI   ALLERGIES: Allergies  Allergen Reactions   Biaxin [Clarithromycin] Other (See Comments)    Stomach upset    HOME MEDICATIONS: Outpatient Medications Prior to Visit  Medication Sig Dispense Refill   albuterol  (PROVENTIL  HFA;VENTOLIN  HFA) 108 (90 Base) MCG/ACT inhaler Inhale 2 puffs into the lungs every 6 (six) hours as needed for wheezing or shortness of breath. 1 Inhaler prn   amitriptyline (ELAVIL) 10 MG tablet Take 20 mg by mouth at bedtime.       amLODipine  (NORVASC ) 2.5 MG tablet Take 2.5 mg by mouth daily.     ANDROGEL  PUMP 20.25 MG/ACT (1.62%) GEL Apply 3 application topically daily at 6 (six) AM. Use as directed     clonazePAM (KLONOPIN) 0.5 MG tablet Take 0.5 mg by mouth at bedtime.       Fluticasone -Salmeterol (ADVAIR DISKUS) 100-50 MCG/DOSE AEPB TAKE 1 PUFF BY MOUTH TWICE A DAY 60 each 0   furosemide  (LASIX ) 20 MG tablet Take 20 mg by mouth daily.     Glucosamine-Chondroitin (GLUCOSAMINE CHONDR COMPLEX PO) Take by mouth.     levothyroxine (SYNTHROID) 25 MCG tablet Take 25 mcg by mouth daily.     Multiple Vitamin (MULTIVITAMIN) tablet Take 1 tablet by mouth daily.       olmesartan (BENICAR)  20 MG tablet Take 20 mg by mouth daily.     sertraline (ZOLOFT) 100 MG tablet Take 100 mg by mouth at bedtime.     SUMAtriptan (IMITREX) 100 MG tablet Take 100 mg by mouth every 2 (two) hours as needed for migraine.      TURMERIC PO Take 1,000 mcg by mouth.     valACYclovir (VALTREX) 1000 MG tablet Take 1,000 mg by mouth daily as needed (outbreak).      meloxicam  (MOBIC ) 15 MG tablet Take 15 mg by mouth daily.     aspirin EC 81 MG tablet Take 81 mg by mouth daily.       fexofenadine  (ALLEGRA ) 60 MG tablet  Take 1 tablet (60 mg total) by mouth daily. 30 tablet prn   predniSONE  (DELTASONE ) 10 MG tablet 4 X 2 DAYS, 3 X 2 DAYS, 2 X 2 DAYS, 1 X 2 DAYS 20 tablet 0   sertraline (ZOLOFT) 50 MG tablet Take 50 mg by mouth daily.       valsartan  (DIOVAN ) 160 MG tablet Take 160 mg by mouth daily.     zafirlukast  (ACCOLATE ) 20 MG tablet TAKE 1 TABLET TWICE DAILY BEFORE MEALS 60 tablet 10   No facility-administered medications prior to visit.    PAST MEDICAL HISTORY: Past Medical History:  Diagnosis Date   Allergic rhinitis    Asthma    Depressed    Headache    migraines   Hepatitis    PMH: HEP A   Hypertension    Kidney stones    Obesity (BMI 30-39.9)    OSA on CPAP    Pneumonia    Restless legs syndrome (RLS)     PAST SURGICAL HISTORY: Past Surgical History:  Procedure Laterality Date   COLONOSCOPY W/ BIOPSIES AND POLYPECTOMY     DISTAL BICEPS TENDON REPAIR Right 08/22/2014   Procedure: RIGHT DISTAL BICEPS TENDON DIRECT PRIMARY REPAIR;  Surgeon: Lonni CINDERELLA Poli, MD;  Location: [facility] OR;  Service: Orthopedics;  Laterality: Right;    FAMILY HISTORY: Family History  Problem Relation Age of Onset   Lung cancer Mother    Stroke Father     SOCIAL HISTORY: Social History   Socioeconomic History   Marital status: Married    Spouse name: Not on file   Number of children: Not on file   Years of education: Not on file   Highest education level: Not on file  Occupational History  Tobacco Use   Smoking status: Never   Smokeless tobacco: Never  Substance and Sexual Activity   Alcohol use: Yes    Comment: occasional beer an liquor   Drug use: No   Sexual activity: Not on file  Other Topics Concern   Not on file  Social History Narrative   Not on file   Social Drivers of Health   Financial Resource Strain: Not on file  Food Insecurity: Low Risk  (04/26/2024)   Received from [facility]   Hunger Vital Sign    Within the past 12 months, you worried that your food would run  out before you got money to buy more: Never true    Within the past 12 months, the food you bought just didn't last and you didn't have money to get more. : Never true  Transportation Needs: No Transportation Needs (04/26/2024)   Received from [facility]    In the past 12 months, has lack of reliable transportation kept you from medical appointments, meetings, work  or from getting things needed for daily living? : No  Physical Activity: Not on file  Stress: Not on file  Social Connections: Not on file  Intimate Partner Violence: Not on file    PHYSICAL EXAM  GENERAL EXAM/CONSTITUTIONAL: Vitals:  Vitals:   08/28/24 1457  BP: 132/80  Weight: 217 lb (98.4 kg)  Height: 5' 10 (1.778 m)   Body mass index is 31.14 kg/m. Wt Readings from Last 3 Encounters:  08/28/24 217 lb (98.4 kg)  09/27/16 246 lb 9.6 oz (111.9 kg)  09/22/15 239 lb 6.4 oz (108.6 kg)   Patient is in no distress; well developed, nourished and groomed; neck is supple  MUSCULOSKELETAL: Gait, strength, tone, movements noted in Neurologic exam below  NEUROLOGIC: MENTAL STATUS:      No data to display         awake, alert, oriented to person, place and time recent and remote memory intact normal attention and concentration language fluent, comprehension intact, naming intact fund of knowledge appropriate  CRANIAL NERVE:  2nd, 3rd, 4th, 6th - Visual fields full to confrontation, extraocular muscles intact, no nystagmus 5th - facial sensation symmetric 7th - facial strength symmetric 8th - hearing intact 9th - palate elevates symmetrically, uvula midline 11th - shoulder shrug symmetric 12th - tongue protrusion midline  MOTOR:  normal bulk and tone, full strength in the BUE, BLE except for a mild left hip flexion weakness 4+/5  SENSORY:  normal and symmetric to light touch  COORDINATION:  finger-nose-finger, fine finger movements normal  REFLEXES:  deep tendon reflexes present  and symmetric  GAIT/STATION:  normal   DIAGNOSTIC DATA (LABS, IMAGING, TESTING) - I reviewed patient records, labs, notes, testing and imaging myself where available.  Lab Results  Component Value Date   WBC 5.6 08/22/2014   HGB 14.5 08/22/2014   HCT 42.1 08/22/2014   MCV 96.3 08/22/2014   PLT 298 08/22/2014      Component Value Date/Time   NA 140 08/22/2014 0803   K 4.5 08/22/2014 0803   CL 105 08/22/2014 0803   CO2 22 08/22/2014 0803   GLUCOSE 118 (H) 08/22/2014 0803   BUN 23 08/22/2014 0803   CREATININE 0.92 08/22/2014 0803   CALCIUM 9.4 08/22/2014 0803   GFRNONAA 89 (L) 08/22/2014 0803   GFRAA >90 08/22/2014 0803   No results found for: CHOL, HDL, LDLCALC, LDLDIRECT, TRIG, CHOLHDL No results found for: YHAJ8R No results found for: VITAMINB12 No results found for: TSH  MRI Brain 08/24/2024 1. Acute infarct in the left subinsular region.  2. Possible small subacute infarct in the right basal ganglia.  3. Chronic microvascular ischemic changes.  4. Mild generalized parenchymal volume loss.    Carotid US  08/24/2024 Right Carotid: There is no evidence of stenosis in the right ICA.  Left Carotid: There is no evidence of stenosis in the left ICA.  Vertebrals:  Bilateral vertebral arteries demonstrate antegrade flow.  Subclavians: Normal flow hemodynamics were seen in bilateral subclavian arteries.     ASSESSMENT AND PLAN  74 y.o. year old male with history of hypertension, depression, asthma, OSA who is presenting after episode of visual change, disequilibrium found to have stroke in the left subinsular region in the right basal ganglia.  Stroke etiology likely small vessel disease but cannot rule out large vessel or cardioembolic.   Acute stroke in the left subinsular region and subacute ischemic stroke in the right basal ganglia Symptoms include left-sided vision disturbances and slight left-sided weakness, likely due  to the subacute  right-sided stroke. The strokes are small but located in deep brain structures, affecting gait and causing weakness. The bilateral nature suggests a possible cardiac source, necessitating further investigation. The strokes are ischemic, another etiology is likely small vessel disease exacerbated by hypertension and hyperlipidemia. - Order CT angiogram of the head and neck to assess blood vessels once BMP is completed (rule out kidney disease) - Order cardiac monitor to rule out arrhythmia. - Order echocardiogram to assess cardiac function. - Restart aspirin 81 mg daily. - Initiate Plavix (clopidogrel) for 21 days, then discontinue. - Continue rosuvastatin 40 mg daily for cholesterol management. - Educate on potential side effects of Aspirin and Plavix, including easy bruising and GI bleed. - Advise on potential side effects of rosuvastatin, including muscle pain and flushing. - Encourage maintaining activity levels and monitor for any new focal deficits.    1. Cerebrovascular accident (CVA) due to occlusion of small artery (HCC)     Patient Instructions  Start aspirin 81 mg daily Start Plavix 75 mg daily for 21 days then discontinued Start Crestor 40 mg nightly Will check lipid panel and BMP Will obtain a CT angiogram head and neck after completion of the BMP Will obtain echocardiogram and a 30-day cardiac monitor Advised patient to present to the ED for any new acute neurological changes Continue to follow with PCP Return if worse  Orders Placed This Encounter  Procedures   Lipid Panel   Basic Metabolic Panel   CARDIAC EVENT MONITOR   ECHOCARDIOGRAM COMPLETE    Meds ordered this encounter  Medications   rosuvastatin (CRESTOR) 40 MG tablet    Sig: Take 1 tablet (40 mg total) by mouth at bedtime.    Dispense:  90 tablet    Refill:  3   clopidogrel (PLAVIX) 75 MG tablet    Sig: Take 1 tablet (75 mg total) by mouth daily.    Dispense:  21 tablet    Refill:  0   aspirin EC 81  MG tablet    Sig: Take 1 tablet (81 mg total) by mouth daily. Swallow whole.    Dispense:  90 tablet    Refill:  4    Return if symptoms worsen or fail to improve.  I have spent a total of 65 minutes dedicated to this patient today, preparing to see patient, performing a medically appropriate examination and evaluation, ordering tests and/or medications and procedures, and counseling and educating the patient/family/caregiver; independently interpreting result and communicating results to the family/patient/caregiver; and documenting clinical information in the electronic medical record.   Pastor Falling, MD 08/28/2024, 4:59 PM  [facility] [address] [place], [place] [address] [phone]

## 2024-08-29 ENCOUNTER — Other Ambulatory Visit: Payer: Self-pay

## 2024-08-29 ENCOUNTER — Other Ambulatory Visit (INDEPENDENT_AMBULATORY_CARE_PROVIDER_SITE_OTHER): Payer: Self-pay

## 2024-08-29 DIAGNOSIS — Z0289 Encounter for other administrative examinations: Secondary | ICD-10-CM

## 2024-08-29 DIAGNOSIS — I6381 Other cerebral infarction due to occlusion or stenosis of small artery: Secondary | ICD-10-CM

## 2024-08-30 ENCOUNTER — Ambulatory Visit: Payer: Self-pay | Admitting: Neurology

## 2024-08-30 ENCOUNTER — Ambulatory Visit (HOSPITAL_COMMUNITY)
Admission: RE | Admit: 2024-08-30 | Discharge: 2024-08-30 | Disposition: A | Discharge status: Home / Self Care | Source: Ambulatory Visit | Attending: Neurology | Admitting: Neurology

## 2024-08-30 DIAGNOSIS — I6381 Other cerebral infarction due to occlusion or stenosis of small artery: Secondary | ICD-10-CM

## 2024-08-30 LAB — LIPID PANEL
Chol/HDL Ratio: 3.5 ratio (ref 0.0–5.0)
Cholesterol, Total: 169 mg/dL (ref 100–199)
HDL: 48 mg/dL (ref >39)
LDL Chol Calc (NIH): 92 mg/dL (ref 0–99)
Triglycerides: 167 mg/dL — ABNORMAL HIGH (ref 0–149)
VLDL Cholesterol Cal: 29 mg/dL (ref 5–40)

## 2024-08-30 LAB — ECHOCARDIOGRAM COMPLETE
AR max vel: 2.32 cm2
AV Area VTI: 2.76 cm2
AV Area mean vel: 2.94 cm2
AV Mean grad: 3 mmHg
AV Peak grad: 6.8 mmHg
Ao pk vel: 1.3 m/s
Area-P 1/2: 3.54 cm2
S' Lateral: 2.8 cm

## 2024-08-30 LAB — BASIC METABOLIC PANEL WITH GFR
BUN/Creatinine Ratio: 21 (ref 10–24)
BUN: 24 mg/dL (ref 8–27)
CO2: 25 mmol/L (ref 20–29)
Calcium: 9.7 mg/dL (ref 8.6–10.2)
Chloride: 95 mmol/L — ABNORMAL LOW (ref 96–106)
Creatinine, Ser: 1.12 mg/dL (ref 0.76–1.27)
Glucose: 121 mg/dL — ABNORMAL HIGH (ref 70–99)
Potassium: 4.3 mmol/L (ref 3.5–5.2)
Sodium: 134 mmol/L (ref 134–144)
eGFR: 70 mL/min/1.73 (ref >59)

## 2024-09-03 ENCOUNTER — Telehealth: Payer: Self-pay | Admitting: Neurology

## 2024-09-03 NOTE — Telephone Encounter (Signed)
 Pt called to request to speak to Md about following up with Heart Monitor . Pt states that he has receive Monitor and not sure who do he report to about the reading of monitor .

## 2024-09-03 NOTE — Telephone Encounter (Signed)
 Phone room: please call pt back. [facility] set him up with this. He should contact them about this at  [phone]

## 2024-09-05 ENCOUNTER — Telehealth: Payer: Self-pay | Admitting: Neurology

## 2024-09-05 NOTE — Telephone Encounter (Signed)
 CT scan sent to [facility] [phone]

## 2024-09-10 NOTE — Telephone Encounter (Signed)
 Pt has been informed.

## 2024-09-10 NOTE — Telephone Encounter (Signed)
 Noted

## 2024-09-12 ENCOUNTER — Telehealth: Payer: Self-pay | Admitting: Neurology

## 2024-09-12 NOTE — Telephone Encounter (Signed)
 Will see what the CT shows but any new neurological symptoms, they should present to the ED

## 2024-09-12 NOTE — Telephone Encounter (Signed)
 Pt wife called stating that Pt is having worsening symptom and need to see MD  sooner . Pt has been having memory issues and Pt balance is completely off . Pt  Wife stated that  they had appt with PCP and pt could barely talk  and get  his sentences out  . Pt wife is concern and would like for Pt to be seen sooner

## 2024-09-12 NOTE — Telephone Encounter (Signed)
 Call to wife who reports gradual  worsening memory and worsening balance in the last week.  They did see his PCP yesterday and she ordered PT and ST because of swallowing concerns. She just reports him not being able to get complete thoughts out and is confused when giving directions. She denies any new stroke symptoms.   Schedule for CT angio tomorrow w/wo contrast.  Advised I would send to Dr. Gregg to review for advice/ recommendations

## 2024-09-13 ENCOUNTER — Telehealth: Payer: Self-pay | Admitting: *Deleted

## 2024-09-13 ENCOUNTER — Ambulatory Visit: Attending: Neurology

## 2024-09-13 ENCOUNTER — Ambulatory Visit
Admission: RE | Admit: 2024-09-13 | Discharge: 2024-09-13 | Disposition: A | Source: Ambulatory Visit | Attending: Neurology

## 2024-09-13 DIAGNOSIS — R9431 Abnormal electrocardiogram [ECG] [EKG]: Secondary | ICD-10-CM

## 2024-09-13 DIAGNOSIS — I6381 Other cerebral infarction due to occlusion or stenosis of small artery: Secondary | ICD-10-CM

## 2024-09-13 MED ORDER — IOPAMIDOL (ISOVUE-370) INJECTION 76%
80.0000 mL | Freq: Once | INTRAVENOUS | Status: AC | PRN
  Administered 2024-09-13: 80 mL via INTRAVENOUS

## 2024-09-13 NOTE — Telephone Encounter (Signed)
 Attempted to contact pt to discuss monitor results from this AM demonstrating intermittent complete heart block with multiple pauses.  Attempting to determine if he was symptomatic during this time.  Left message to call back to review.

## 2024-09-14 NOTE — Telephone Encounter (Signed)
 [name] Brown return the call  and RN was able to speak to her.  See the below  note

## 2024-09-14 NOTE — Telephone Encounter (Signed)
   Cardiac Monitor Alert  Date of alert:  09/14/2024   Patient Name: [name] Brown  DOB: Jun 30, 1951  MRN: [id number]   [facility] Cardiologist: None  [facility] EP:  None    Monitor Information: Cardiac Event Monitor [[employer]]  Reason:  CVA Ordering provider:  Dr Gregg  ( neurology)    Alert Complete Heart Block Pause(s) - Longest:  longest 3.5 seconds This is the 1st alert for this rhythm.  0n 09/13/24  Next Cardiology Appointment   Date:  N/A  Provider:  N/A   patient has not establish with Cardiology   The patient could NOT be reached by telephone today.  Tried at 9:59 am, attempted to call Proxy - wife  [name] no response left message to call back  10:20 am  wife return the call- She states patient did not indicate any symptoms yesterday - They were at Radiology having a CT Scan - Brain-- she states he does not  remember to tell her. RN  informed Mrs Rogala  will call her back withistructuins Arrhythmia, symptoms and history reviewed with  Dr Delford.   Plan:   Per Dr Nishan  ( 11:24 am) , continue wearing the Monitor, an appt with EP to follow . Referral  sent to for appt   Other:  Called and spoke to wife and Patient . Both are awarwe. Wife prefer contacting her because patient forgets.  Gladis Reena GAILS, RN  09/14/2024 9:52 AM

## 2024-09-14 NOTE — Telephone Encounter (Signed)
 Late entry - this monitor reading was reviewed by Dr Shlomo on 11/13.

## 2024-09-14 NOTE — Telephone Encounter (Signed)
 Patient and wife aware of the referral for EP.   Strips are in EPIC.

## 2024-09-15 ENCOUNTER — Other Ambulatory Visit: Payer: Self-pay | Admitting: Neurology

## 2024-09-17 ENCOUNTER — Ambulatory Visit: Attending: Family Medicine | Admitting: Physical Therapy

## 2024-09-17 VITALS — BP 108/71 | HR 85

## 2024-09-17 DIAGNOSIS — R2689 Other abnormalities of gait and mobility: Secondary | ICD-10-CM | POA: Insufficient documentation

## 2024-09-17 DIAGNOSIS — M6281 Muscle weakness (generalized): Secondary | ICD-10-CM | POA: Diagnosis present

## 2024-09-17 DIAGNOSIS — R2681 Unsteadiness on feet: Secondary | ICD-10-CM | POA: Insufficient documentation

## 2024-09-17 DIAGNOSIS — R278 Other lack of coordination: Secondary | ICD-10-CM | POA: Insufficient documentation

## 2024-09-17 NOTE — Therapy (Signed)
 OUTPATIENT PHYSICAL THERAPY NEURO EVALUATION   Patient Name: [name] Brown MRN: [id number] DOB:1950-11-10, 73 y.o., male Today's Date: 09/17/2024   PCP: [name] Bottcher, NP REFERRING PROVIDER: Cristopher Bottcher, NP  END OF SESSION:  PT End of Session - 09/17/24 1019     Visit Number 1    Number of Visits 13   with eval   Date for Recertification  11/12/24   to allow for scheduling delays   Authorization Type Medicare    PT Start Time 1015    PT Stop Time 1055    PT Time Calculation (min) 40 min    Equipment Utilized During Treatment Gait belt    Activity Tolerance Patient tolerated treatment well    Behavior During Therapy WFL for tasks assessed/performed          Past Medical History:  Diagnosis Date   Allergic rhinitis    Asthma    Depressed    Headache    migraines   Hepatitis    PMH: HEP A   Hypertension    Kidney stones    Obesity (BMI 30-39.9)    OSA on CPAP    Pneumonia    Restless legs syndrome (RLS)    Past Surgical History:  Procedure Laterality Date   COLONOSCOPY W/ BIOPSIES AND POLYPECTOMY     DISTAL BICEPS TENDON REPAIR Right 08/22/2014   Procedure: RIGHT DISTAL BICEPS TENDON DIRECT PRIMARY REPAIR;  Surgeon: [name] CINDERELLA Poli, MD;  Location: [facility] OR;  Service: Orthopedics;  Laterality: Right;   Patient Active Problem List   Diagnosis Date Noted   Traumatic rupture of right distal biceps tendon 08/22/2014   HTN (hypertension) 09/05/2013   Obesity (BMI 30-39.9)    Restless legs syndrome (RLS)    Obstructive sleep apnea 08/25/2011   Seasonal and perennial allergic rhinitis 08/21/2007   Mild intermittent asthma in adult without complication 08/21/2007    ONSET DATE: 09/11/2024 (referral date)  REFERRING DIAG: R26.89 (ICD-10-CM) - Other abnormalities of gait and mobility I63.81 (ICD-10-CM) - 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 He has had one fall since his stroke in the bedroom, was not injured during the fall. His wife had to help him move a bag of clothes out of the way then he was able to get onto all fours to get back up. He does report some vision changes since his stroke, vision can be blurry in his L eye and occasionally it is like he is looking through a screen/grid. He also reports not being able to get answers out as quickly as he wants to, has a speech therapy referral. Prior to his stroke he enjoyed playing golf 3x/week, hasn't been able to do that recently.   Pt accompanied by: significant other wife [name] Brown  PERTINENT HISTORY: [name] Brown is a 73 year old male with a history of migraines, sleep apnea, hypertension, depression who presents with vision changes and balance issues, had a recent MRI showing recent strokes.   PAIN:  Are you having pain? No  PRECAUTIONS: Fall  RED FLAGS: None   WEIGHT BEARING RESTRICTIONS: No  FALLS: Has patient fallen in last 6 months? Yes. Number of falls 1, fell in the bedroom - no injuries, was able to get back up himself  LIVING ENVIRONMENT: Lives with: lives with their spouse Lives in: House/apartment Stairs: Yes: Internal: 12 steps; on right going up; wife provides Supervision on stairs Has following equipment at home: CPAP machine  PLOF: Independent  PATIENT GOALS: to get my balance better  OBJECTIVE:  Note: Objective measures were completed at Evaluation unless otherwise  noted.  DIAGNOSTIC FINDINGS:  MRI Brain 08/24/2024 1. Acute infarct in the left subinsular region.  2. Possible small subacute infarct in the right basal ganglia.  3. Chronic microvascular ischemic changes.  4. Mild generalized parenchymal volume loss.     Carotid US  08/24/2024 Right Carotid: There is no evidence of stenosis in the right ICA.  Left Carotid: There is no evidence of stenosis in the left ICA.  Vertebrals:  Bilateral vertebral arteries demonstrate antegrade flow.  Subclavians: Normal flow hemodynamics were seen in bilateral subclavian arteries.   COGNITION: Overall cognitive status: Within functional limits for tasks assessed   SENSATION: No N/T Light touch WFL  COORDINATION: Impaired in BUE and BLE slightly  EDEMA:  R ankle swelling  POSTURE: rounded shoulders and forward head  LOWER EXTREMITY ROM:     Active  Right Eval Left Eval  Hip flexion    Hip extension    Hip abduction    Hip adduction    Hip internal rotation    Hip external rotation    Knee flexion    Knee extension Tight HS Tight HS  Ankle dorsiflexion    Ankle plantarflexion    Ankle inversion    Ankle eversion     (Blank rows = not tested)  LOWER EXTREMITY MMT:    MMT Right Eval Left Eval  Hip flexion 5 4  Hip extension    Hip abduction    Hip adduction    Hip internal rotation    Hip external rotation    Knee flexion 5 4  Knee extension 5 4  Ankle dorsiflexion 5 5  Ankle plantarflexion    Ankle inversion    Ankle eversion    (Blank rows = not tested)  BED MOBILITY:  Feels wobbly when he first stands up, otherwise mod I for bed mobility  TRANSFERS: Sit to stand: SBA  Assistive device utilized: None     Stand  to sit: SBA  Assistive device utilized: None     Chair to chair: SBA  Assistive device utilized: None       RAMP:  Not tested  CURB:  Not tested  STAIRS: STAIRS:  Level of Assistance: CGA  Stair Negotiation Technique: Step to Pattern Alternating  Pattern  with Bilateral Rails  Number of Stairs: 4   Height of Stairs: 6  Comments: alternating pattern ascending, step to pattern descending   GAIT: Findings:  GAIT: Gait pattern: scissoring, ataxic, and narrow BOS Distance walked: various clinic distances Assistive device utilized: None Level of assistance: CGA Comments: slowed gait speed  FUNCTIONAL TESTS:    [facility] PT Assessment - 09/17/24 1034       Ambulation/Gait   Gait velocity 32.8 ft over 16.66 sec = 1.97 ft/sec      Standardized Balance Assessment   Standardized Balance Assessment Timed Up and Go Test;Five Times Sit to Stand    Five times sit to stand comments  27 sec   no UE     Timed Up and Go Test   TUG Normal TUG    Normal TUG (seconds) 18.69      Functional Gait  Assessment   Gait assessed  Yes    Gait Level Surface Walks 20 ft, slow speed, abnormal gait pattern, evidence for imbalance or deviates 10-15 in outside of the 12 in walkway width. Requires more than 7 sec to ambulate 20 ft.    Change in Gait Speed Makes only minor adjustments to walking speed, or accomplishes a change in speed with significant gait deviations, deviates 10-15 in outside the 12 in walkway width, or changes speed but loses balance but is able to recover and continue walking.    Gait with Horizontal Head Turns Performs head turns with moderate changes in gait velocity, slows down, deviates 10-15 in outside 12 in walkway width but recovers, can continue to walk.    Gait with Vertical Head Turns Performs task with moderate change in gait velocity, slows down, deviates 10-15 in outside 12 in walkway width but recovers, can continue to walk.    Gait and Pivot Turn Pivot turns safely in greater than 3 sec and stops with no loss of balance, or pivot turns safely within 3 sec and stops with mild imbalance, requires small steps to catch balance.    Step Over Obstacle Cannot perform without assistance.    Gait with Narrow Base of Support Ambulates  less than 4 steps heel to toe or cannot perform without assistance.    Gait with Eyes Closed Cannot walk 20 ft without assistance, severe gait deviations or imbalance, deviates greater than 15 in outside 12 in walkway width or will not attempt task.    Ambulating Backwards Walks 20 ft, slow speed, abnormal gait pattern, evidence for imbalance, deviates 10-15 in outside 12 in walkway width.    Steps Two feet to a stair, must use rail.    Total Score 8    FGA comment: 8/30, high fall risk         mCTSIB: Condition 1: 30 sec Condition 2: 30 sec Condition 3: 30 sec Condition 4: 1 sec       =91/120  TREATMENT DATE:  PT Evaluation  Self-Care/Home Management Vitals:   09/17/24 1032  BP: 108/71  Pulse: 85   BP assessed in LUE while patient at rest, vitals WNL. Encouraged him to continue to take his medication as prescribed and continue to monitor his BP as well as importance of monitoring BP to prevent another stroke.   PATIENT EDUCATION: Education details: Eval findings, PT POC, results of OM and functional implications, see above regarding BP management Person educated: Patient and Spouse Education method: Explanation and Demonstration Education comprehension: verbalized understanding, returned demonstration, and needs further education  HOME EXERCISE PROGRAM: To be initiated  GOALS: Goals reviewed with patient? Yes  SHORT TERM GOALS: Target date: 10/08/2024   Pt will be independent with initial HEP for improved strength, balance, transfers and gait. Baseline: Goal status: INITIAL  2.  Pt will improve FGA to 12/30 for decreased fall risk  Baseline: 8/30 (11/17) Goal status: INITIAL  3.  Pt will improve Condition 4 on mCTSIB to 15 sec for improved balance Baseline: 1 sec (11/17) Goal status: INITIAL   LONG TERM GOALS: Target date:  10/31/2024   Pt will be independent with final HEP for improved strength, balance, transfers and gait. Baseline:  Goal status: INITIAL  2.  Pt will improve 5 x STS to less than or equal to 24 seconds to demonstrate improved functional strength and transfer efficiency.  Baseline: 27 sec no UE (11/17) Goal status: INITIAL  3.  Pt will improve gait velocity to at least 2.25 ft/sec for improved gait efficiency and performance at mod I level  Baseline: 1.97 ft/sec SBA (11/17) Goal status: INITIAL  4.  Pt will improve normal TUG to less than or equal to 15 seconds for improved functional mobility and decreased fall risk. Baseline: 18.69 sec no AD (111/7) Goal status: INITIAL  5.  Pt will improve FGA to 16/30 for decreased fall risk  Baseline: 8/30 (11/17) Goal status: INITIAL  6.  Pt will improve Condition 4 on mCTSIB to 30 sec for improved balance Baseline: 1 sec (11/17) Goal status: INITIAL  ASSESSMENT:  CLINICAL IMPRESSION: Patient is a 73 year old male referred to Neuro OPPT for acute CVA.   Pt's PMH is significant for: migraines, sleep apnea, hypertension, depression. The following deficits were present during the exam: decreased LLE strength, slightly ataxic gait, impaired balance, and with potential visual impairments. Based on his 5xSTS score, gait speed, TUG score, FGA score, and mCSTIB score as well as his gait deviations and fall history, pt is an increased risk for falls. Pt would benefit from skilled PT to address these impairments and functional limitations to maximize functional mobility independence.   OBJECTIVE IMPAIRMENTS: Abnormal gait, decreased balance, decreased coordination, difficulty walking, decreased strength, impaired perceived functional ability, and impaired vision/preception.   ACTIVITY LIMITATIONS: carrying, lifting, bending, squatting, stairs, and bed mobility  PARTICIPATION LIMITATIONS: meal prep, cleaning, laundry, medication management, interpersonal  relationship, driving, and community activity  PERSONAL FACTORS: Age and 3+ comorbidities:   migraines, sleep apnea, hypertension, depressionare also affecting patient's functional outcome.   REHAB POTENTIAL: Good  CLINICAL DECISION MAKING: Evolving/moderate complexity  EVALUATION COMPLEXITY: Moderate  PLAN:  PT FREQUENCY: 2x/week  PT DURATION: 6 weeks  PLANNED INTERVENTIONS: 97164- PT Re-evaluation, 97750- Physical Performance Testing, 97110-Therapeutic exercises, 97530- Therapeutic activity, [id number]- Neuromuscular re-education, 97535- Self Care, [id number]- Manual therapy, [id number]- Gait training, [id number]- Orthotic Initial, [id number]- Orthotic/Prosthetic subsequent, [phone]- Electrical stimulation (manual), [phone] (1-2 muscles), 20561 (3+ muscles)- Dry Needling, Patient/Family education, Balance training, Stair  training, Taping, Joint mobilization, Spinal mobilization, Visual/preceptual remediation/compensation, Cognitive remediation, DME instructions, Cryotherapy, and Moist heat  PLAN FOR NEXT SESSION: assess visual impairments further and how they impact balance, initiate HEP: sit to stands no UE, staggered sit to stands, squats, based on FGA (head turns, obstacles, tandem, EC, backwards) pt scissors, narrow BOS, hip abd strengthening?   Pax Reasoner, PT [place]ell [place], PT, DPT, CSRS  09/17/2024, 11:36 AM

## 2024-09-17 NOTE — Telephone Encounter (Signed)
 This means that there no abnormalities found with you heart, your chambers and valves are working properly.

## 2024-09-19 ENCOUNTER — Encounter: Payer: Self-pay | Admitting: Cardiovascular Disease

## 2024-09-19 ENCOUNTER — Ambulatory Visit: Attending: Cardiovascular Disease | Admitting: Cardiovascular Disease

## 2024-09-19 VITALS — BP 102/61 | HR 84 | Resp 16 | Ht 70.0 in | Wt 218.2 lb

## 2024-09-19 DIAGNOSIS — I6381 Other cerebral infarction due to occlusion or stenosis of small artery: Secondary | ICD-10-CM | POA: Diagnosis not present

## 2024-09-19 DIAGNOSIS — I442 Atrioventricular block, complete: Secondary | ICD-10-CM | POA: Insufficient documentation

## 2024-09-19 NOTE — Progress Notes (Signed)
  Electrophysiology Office Note:    Date:  09/19/2024   ID:  [name] Brown, DOB 1951/04/16, MRN [id number]  PCP:  Arloa Elsie SAUNDERS, MD   [facility] Providers Cardiologist:  None     Referring MD: Delford Maude BROCKS, MD   History of Present Illness:    [name] Brown is a 73 y.o. male with a medical history significant for stroke concerning for cardioembolic etiology, referred for rhythm management.     He is currently wearing a monitor for detection of atrial fibrillation after suffering a stroke.  We received an alert that he had a pause lasting 3.5 seconds on September 13, 2024.  The patient's wife reported that he was having a CT scan of the brain at the time of his falls.  Discussed the use of AI scribe software for clinical note transcription with the patient, who gave verbal consent to proceed.  History of Present Illness  In October, he experienced a stroke with imaging revealing infarcts on both sides of the brain. During cardiac monitoring, bradycardia was noted, with temporary slowing of the heart rate occurring around the time of a CT scan. He did not experience lightheadedness or syncope, although he almost dozed off during the scan.  He has never had an episode of unexplained lightheadedness or near syncope         Today, he reports that he is at baseline and has no acute complaints.  EKGs/Labs/Other Studies Reviewed Today:     Echocardiogram:  TTE August 30, 2024 LVEF 60 to 65%.  Grade 1 diastolic dysfunction.  Prominent lipomatous atrial septal hypertrophy.   Monitors:   day monitor   -- my interpretation Results pending    EKG:   EKG Interpretation Date/Time:  Wednesday September 19 2024 11:06:07 EST Ventricular Rate:  79 PR Interval:  192 QRS Duration:  86 QT Interval:  358 QTC Calculation: 410 R Axis:   22  Text Interpretation: Normal sinus rhythm Cannot rule out Anterior infarct (cited on or before 19-Sep-2024) When compared with  ECG of 22-Aug-2014 08:19, No significant change was found Confirmed by Nancey Scotts [phone]) on 09/19/2024 11:56:09 AM     Physical Exam:    VS:  BP 102/61 (BP Location: Left Arm, Patient Position: Sitting, Cuff Size: Large)   Pulse 84   Resp 16   Ht 5' 10 (1.778 m)   Wt 218 lb 3.2 oz (99 kg)   SpO2 93%   BMI 31.31 kg/m     Wt Readings from Last 3 Encounters:  09/19/24 218 lb 3.2 oz (99 kg)  08/28/24 217 lb (98.4 kg)  09/27/16 246 lb 9.6 oz (111.9 kg)     GEN: Well nourished, well developed in no acute distress CARDIAC: RRR, no murmurs, rubs, gallops RESPIRATORY:  Normal work of breathing MUSCULOSKELETAL: no edema    ASSESSMENT & PLAN:     Incidental bradycardia noted on monitor No symptoms associate with episode, no history of lightheadedness 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

## 2024-09-19 NOTE — Patient Instructions (Addendum)
 Medication Instructions:  Your physician recommends that you continue on your current medications as directed. Please refer to the Current Medication list given to you today.  *If you need a refill on your cardiac medications before your next appointment, please call your pharmacy*  Lab Work: None ordered.  If you have labs (blood work) drawn today and your tests are completely normal, you will receive your results only by: MyChart Message (if you have MyChart) OR A paper copy in the mail If you have any lab test that is abnormal or we need to change your treatment, we will call you to review the results.  Testing/Procedures: None ordered.   Follow-Up: At [facility], you and your health needs are our priority.  As part of our continuing mission to provide you with exceptional heart care, our providers are all part of one team.  This team includes your primary Cardiologist (physician) and Advanced Practice Providers or APPs (Physician Assistants and Nurse Practitioners) who all work together to provide you with the care you need, when you need it.  Your next appointment:   8 weeks with Dr Nancey  Monday, 11/12/2024 at 230pm

## 2024-09-21 ENCOUNTER — Encounter: Payer: Self-pay | Admitting: Neurology

## 2024-09-21 ENCOUNTER — Other Ambulatory Visit: Payer: Self-pay | Admitting: Neurology

## 2024-09-21 ENCOUNTER — Ambulatory Visit: Payer: Self-pay | Admitting: Neurology

## 2024-09-21 MED ORDER — CLOPIDOGREL BISULFATE 75 MG PO TABS: 75.0000 mg | ORAL_TABLET | Freq: Every day | ORAL | 0 refills | Status: AC

## 2024-09-25 ENCOUNTER — Ambulatory Visit: Admitting: Physical Therapy

## 2024-09-25 VITALS — BP 112/72 | HR 85

## 2024-09-25 DIAGNOSIS — M6281 Muscle weakness (generalized): Secondary | ICD-10-CM

## 2024-09-25 DIAGNOSIS — R278 Other lack of coordination: Secondary | ICD-10-CM

## 2024-09-25 DIAGNOSIS — R2681 Unsteadiness on feet: Secondary | ICD-10-CM

## 2024-09-25 DIAGNOSIS — R2689 Other abnormalities of gait and mobility: Secondary | ICD-10-CM

## 2024-09-25 NOTE — Therapy (Signed)
 OUTPATIENT PHYSICAL THERAPY NEURO TREATMENT   Patient Name: [name] Brown MRN: [id number] DOB:06/21/1951, 73 y.o., male Today's Date: 09/25/2024   PCP: Cristopher Bottcher, NP REFERRING PROVIDER: Cristopher Bottcher, NP  END OF SESSION:  PT End of Session - 09/25/24 0932     Visit Number 2    Number of Visits 13   with eval   Date for Recertification  11/12/24   to allow for scheduling delays   Authorization Type Medicare    PT Start Time 0930    PT Stop Time 1011    PT Time Calculation (min) 41 min    Equipment Utilized During Treatment Gait belt    Activity Tolerance Patient tolerated treatment well    Behavior During Therapy WFL for tasks assessed/performed           Past Medical History:  Diagnosis Date   Allergic rhinitis    Asthma    Depressed    Headache    migraines   Hepatitis    PMH: HEP A   Hypertension    Kidney stones    Obesity (BMI 30-39.9)    OSA on CPAP    Pneumonia    Restless legs syndrome (RLS)    Past Surgical History:  Procedure Laterality Date   COLONOSCOPY W/ BIOPSIES AND POLYPECTOMY     DISTAL BICEPS TENDON REPAIR Right 08/22/2014   Procedure: RIGHT DISTAL BICEPS TENDON DIRECT PRIMARY REPAIR;  Surgeon: Lonni CINDERELLA Poli, MD;  Location: [facility] OR;  Service: Orthopedics;  Laterality: Right;   Patient Active Problem List   Diagnosis Date Noted   Traumatic rupture of right distal biceps tendon 08/22/2014   HTN (hypertension) 09/05/2013   Obesity (BMI 30-39.9)    Restless legs syndrome (RLS)    Obstructive sleep apnea 08/25/2011   Seasonal and perennial allergic rhinitis 08/21/2007   Mild intermittent asthma in adult without complication 08/21/2007    ONSET DATE: 09/11/2024 (referral date)  REFERRING DIAG: R26.89 (ICD-10-CM) - Other abnormalities of gait and mobility I63.81 (ICD-10-CM) - 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 Pt reports that things have been fine  with his vision, no headaches, no N/T. He does continue to have trouble with word-finding and his short term memory.  Pt's wife asking if he is safe to be going up/down stairs to get to bedroom, bathroom, etc.; he uses handrail, doesn't carry anything, barefoot or with shoes on but never with socks on. His wife also reports that he was walking in his son's yard going downhill across uneven ground and had a near fall - had to catch himself on a tree.   Pt accompanied by: significant other wife [name]  PERTINENT HISTORY: [name] is a 73 year old male with a history of migraines, sleep apnea, hypertension, depression who presents with vision changes and balance issues, had a recent MRI showing recent strokes.   PAIN:  Are you having pain? No  PRECAUTIONS: Fall  RED FLAGS: None   WEIGHT BEARING RESTRICTIONS: No  FALLS: Has patient fallen in last 6 months? Yes. Number of falls 1, fell in the bedroom - no injuries, was able to get back up himself  LIVING ENVIRONMENT: Lives with: lives with their spouse Lives in: House/apartment Stairs: Yes: Internal: 12 steps; on right going up; wife provides Supervision on stairs Has following equipment at home: CPAP machine  PLOF: Independent  PATIENT GOALS: to get my balance better  OBJECTIVE:  Note: Objective measures were completed at Evaluation unless otherwise noted.  DIAGNOSTIC FINDINGS:  MRI Brain 08/24/2024 1. Acute infarct in the left subinsular region.  2. Possible small subacute  infarct in the right basal ganglia.  3. Chronic microvascular ischemic changes.  4. Mild generalized parenchymal volume loss.     Carotid US  08/24/2024 Right Carotid: There is no evidence of stenosis in the right ICA.  Left Carotid: There is no evidence of stenosis in the left ICA.  Vertebrals:  Bilateral vertebral arteries demonstrate antegrade flow.  Subclavians: Normal flow hemodynamics were seen in bilateral subclavian arteries.   COGNITION: Overall cognitive status: Within functional limits for tasks assessed   SENSATION: No N/T Light touch WFL  COORDINATION: Impaired in BUE and BLE slightly  EDEMA:  R ankle swelling  POSTURE: rounded shoulders and forward head  LOWER EXTREMITY ROM:     Active  Right Eval Left Eval  Hip flexion    Hip extension    Hip abduction    Hip adduction    Hip internal rotation    Hip external rotation    Knee flexion    Knee extension Tight HS Tight HS  Ankle dorsiflexion    Ankle plantarflexion    Ankle inversion    Ankle eversion     (Blank rows = not tested)  LOWER EXTREMITY MMT:    MMT Right Eval Left Eval  Hip flexion 5 4  Hip extension    Hip abduction    Hip adduction    Hip internal rotation    Hip external rotation    Knee flexion 5 4  Knee extension 5 4  Ankle dorsiflexion 5 5  Ankle plantarflexion    Ankle inversion    Ankle eversion    (Blank rows = not tested)  BED MOBILITY:  Feels wobbly when he first stands up, otherwise mod I for bed mobility  TRANSFERS: Sit to stand: SBA  Assistive device utilized: None     Stand to sit: SBA  Assistive device utilized: None     Chair to chair: SBA  Assistive device utilized: None       RAMP:  Not tested  CURB:  Not tested  STAIRS: STAIRS:  Level of Assistance: CGA  Stair Negotiation Technique: Step to Pattern Alternating Pattern  with Bilateral Rails  Number of Stairs: 4   Height of Stairs: 6  Comments: alternating pattern ascending, step to pattern  descending   GAIT: Findings:  GAIT: Gait pattern: scissoring, ataxic, and narrow BOS Distance walked: various clinic distances Assistive device utilized: None Level of assistance: CGA Comments: slowed gait speed  FUNCTIONAL TESTS:     mCTSIB: Condition 1: 30 sec Condition 2: 30 sec Condition 3: 30 sec Condition 4: 1 sec       =91/120                                                                                                                               TREATMENT DATE:    Self-Care/Home Management Vitals:   09/25/24 0942  BP: 112/72  Pulse: 85   BP assessed in LUE while patient at rest, vitals WNL.   Discussed that he is safe to continue navigating stairs at home as he uses the handrail, has proper footwear, and as long as he is not carrying anything.  Pt's wife also asking about a stationary bicycle, recommended a recumbent bicycle and that they could join [employer] or a local gym if they are not able to purchase the equipment.   TherAct To work on functional LE strengthening: Sit to stands (no UE) 3 x 10 reps Staggered sit to stands with LLE pulled back 3 x 10 reps Squats 3 x 10 reps Resisted lateral sidestepping with green TB 3 x 10 ft L/R Does need intermittent BUE support   Added to initial HEP, see bolded below    PATIENT EDUCATION: Education details: see above under self-care, initial HEP Person educated: Patient and Spouse Education method: Explanation, Demonstration, Tactile cues, Verbal cues, and Handouts Education comprehension: verbalized understanding, returned demonstration, and needs further education  HOME EXERCISE PROGRAM: Access Code: [email or web address] URL: [email or web address] Date: 09/25/2024 Prepared by: Waddell Southgate  Exercises - Sit to Stand Without Arm Support  - 1 x daily - 7 x weekly - 3 sets - 10 reps - Staggered Sit-to-Stand  - 1 x daily - 7 x weekly - 3 sets - 10 reps - Squat with Chair Touch  - 1 x daily -  7 x weekly - 3 sets - 10 reps - Side Stepping with Resistance at Ankles and Counter Support  - 1 x daily - 7 x weekly - 3 sets - 10 reps  GOALS: Goals reviewed with patient? Yes  SHORT TERM GOALS: Target date: 10/08/2024   Pt will be independent with initial HEP for improved strength, balance, transfers and gait. Baseline: Goal status: INITIAL  2.  Pt will improve FGA to 12/30 for decreased fall risk  Baseline: 8/30 (11/17) Goal status: INITIAL  3.  Pt will improve Condition 4 on mCTSIB to 15 sec for improved balance Baseline: 1 sec (  11/17) Goal status: INITIAL   LONG TERM GOALS: Target date: 10/31/2024   Pt will be independent with final HEP for improved strength, balance, transfers and gait. Baseline:  Goal status: INITIAL  2.  Pt will improve 5 x STS to less than or equal to 24 seconds to demonstrate improved functional strength and transfer efficiency.  Baseline: 27 sec no UE (11/17) Goal status: INITIAL  3.  Pt will improve gait velocity to at least 2.25 ft/sec for improved gait efficiency and performance at mod I level  Baseline: 1.97 ft/sec SBA (11/17) Goal status: INITIAL  4.  Pt will improve normal TUG to less than or equal to 15 seconds for improved functional mobility and decreased fall risk. Baseline: 18.69 sec no AD (111/7) Goal status: INITIAL  5.  Pt will improve FGA to 16/30 for decreased fall risk  Baseline: 8/30 (11/17) Goal status: INITIAL  6.  Pt will improve Condition 4 on mCTSIB to 30 sec for improved balance Baseline: 1 sec (11/17) Goal status: INITIAL  ASSESSMENT:  CLINICAL IMPRESSION: Emphasis of skilled PT session on continuing to monitor vitals, educating patient and his wife on stair safety and aerobic exercise, and creating his initial HEP. His BP remains WNL. Encouraged him to continue navigating stairs at home as he and his wife exhibit good safety awareness and encouraged aerobic exercise with walking and/or a recumbent bike. He  benefits from strengthening and balance exercises added to his HEP. He continues to benefit from skilled PT services to work towards LTGs. Continue POC.    OBJECTIVE IMPAIRMENTS: Abnormal gait, decreased balance, decreased coordination, difficulty walking, decreased strength, impaired perceived functional ability, and impaired vision/preception.   ACTIVITY LIMITATIONS: carrying, lifting, bending, squatting, stairs, and bed mobility  PARTICIPATION LIMITATIONS: meal prep, cleaning, laundry, medication management, interpersonal relationship, driving, and community activity  PERSONAL FACTORS: Age and 3+ comorbidities:   migraines, sleep apnea, hypertension, depressionare also affecting patient's functional outcome.   REHAB POTENTIAL: Good  CLINICAL DECISION MAKING: Evolving/moderate complexity  EVALUATION COMPLEXITY: Moderate  PLAN:  PT FREQUENCY: 2x/week  PT DURATION: 6 weeks  PLANNED INTERVENTIONS: 97164- PT Re-evaluation, 97750- Physical Performance Testing, 97110-Therapeutic exercises, 97530- Therapeutic activity, [id number]- Neuromuscular re-education, 97535- Self Care, 02859- Manual therapy, [id number]- Gait training, [phone]- Orthotic Initial, [phone]- Orthotic/Prosthetic subsequent, [phone]- Electrical stimulation (manual), [phone] (1-2 muscles), 20561 (3+ muscles)- Dry Needling, Patient/Family education, Balance training, Stair training, Taping, Joint mobilization, Spinal mobilization, Visual/preceptual remediation/compensation, Cognitive remediation, DME instructions, Cryotherapy, and Moist heat  PLAN FOR NEXT SESSION: assess visual impairments further and how they impact balance, how is initial HEP? Add to HEP PRN: based on FGA (head turns, obstacles, tandem, EC, backwards) pt scissors, narrow BOS, hip abd strengthening?; rockerboard, sit to stand on wedge, posterior LOB, ankle strategy   Waddell Southgate, PT Waddell Southgate, PT, DPT, CSRS  09/25/2024, 10:11 AM

## 2024-10-01 ENCOUNTER — Telehealth: Payer: Self-pay | Admitting: Cardiovascular Disease

## 2024-10-01 NOTE — Telephone Encounter (Signed)
   Cardiac Monitor Alert  Date of alert:  10/01/2024   Patient Name: [name] Brown  DOB: [birth year]  MRN: [id number]   [facility] Cardiologist: None  [facility] EP:  None    Monitor Information: Cardiac Event Monitor [[employer]]  Reason:  CVA Ordering provider:  Dr Gregg ( neurology)    Alert SR/SB with PACs, 3.0 second pause Occurred 09/28/24 This is the 2nd alert for similar rhythm.   Next Cardiology Appointment   Date:  11/12/24  Provider:  Mealor MD  Evaluated by EP on 09/19/24  The patient was contacted today.  He is asymptomatic. Routed to Dr. Belvia for review.   Plan:  pending   Other: Has OSA - wears CPAP  Estaban Mainville M, RN  10/01/2024 2:45 PM

## 2024-10-03 ENCOUNTER — Ambulatory Visit: Payer: Self-pay

## 2024-10-08 ENCOUNTER — Ambulatory Visit

## 2024-10-11 ENCOUNTER — Ambulatory Visit

## 2024-10-11 DIAGNOSIS — R2681 Unsteadiness on feet: Secondary | ICD-10-CM | POA: Insufficient documentation

## 2024-10-11 DIAGNOSIS — R4701 Aphasia: Secondary | ICD-10-CM | POA: Diagnosis present

## 2024-10-11 DIAGNOSIS — R278 Other lack of coordination: Secondary | ICD-10-CM | POA: Diagnosis present

## 2024-10-11 DIAGNOSIS — M6281 Muscle weakness (generalized): Secondary | ICD-10-CM

## 2024-10-11 DIAGNOSIS — R41841 Cognitive communication deficit: Secondary | ICD-10-CM | POA: Insufficient documentation

## 2024-10-11 DIAGNOSIS — R2689 Other abnormalities of gait and mobility: Secondary | ICD-10-CM

## 2024-10-11 NOTE — Therapy (Signed)
 OUTPATIENT PHYSICAL THERAPY NEURO TREATMENT   Patient Name: [name] Brown MRN: [id number] DOB:Mar 18, 1951, 73 y.o., male Today's Date: 10/11/2024   PCP: [name] Bottcher, NP REFERRING PROVIDER: Cristopher Bottcher, NP  END OF SESSION:  PT End of Session - 10/11/24 0914     Visit Number 3    Number of Visits 13   with eval   Date for Recertification  11/12/24   to allow for scheduling delays   Authorization Type Medicare    PT Start Time 0930    PT Stop Time 1015    PT Time Calculation (min) 45 min    Equipment Utilized During Treatment Gait belt    Activity Tolerance Patient tolerated treatment well    Behavior During Therapy WFL for tasks assessed/performed           Past Medical History:  Diagnosis Date   Allergic rhinitis    Asthma    Depressed    Headache    migraines   Hepatitis    PMH: HEP A   Hypertension    Kidney stones    Obesity (BMI 30-39.9)    OSA on CPAP    Pneumonia    Restless legs syndrome (RLS)    Past Surgical History:  Procedure Laterality Date   COLONOSCOPY W/ BIOPSIES AND POLYPECTOMY     DISTAL BICEPS TENDON REPAIR Right 08/22/2014   Procedure: RIGHT DISTAL BICEPS TENDON DIRECT PRIMARY REPAIR;  Surgeon: [name] CINDERELLA Poli, MD;  Location: [facility] OR;  Service: Orthopedics;  Laterality: Right;   Patient Active Problem List   Diagnosis Date Noted   Traumatic rupture of right distal biceps tendon 08/22/2014   HTN (hypertension) 09/05/2013   Obesity (BMI 30-39.9)    Restless legs syndrome (RLS)    Obstructive sleep apnea 08/25/2011   Seasonal and perennial allergic rhinitis 08/21/2007   Mild intermittent asthma in adult without complication 08/21/2007    ONSET DATE: 09/11/2024 (referral date)  REFERRING DIAG: R26.89 (ICD-10-CM) - Other abnormalities of gait and mobility I63.81 (ICD-10-CM) - 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 No falls. He reports mild improvement with his balance overall. Wife has to remind him to do  exercises.   Pt accompanied by: significant other wife [name] Brown  PERTINENT HISTORY: [name] Brown is a 73 year old male with a history of migraines, sleep apnea, hypertension, depression who presents with vision changes and balance issues, had a recent MRI showing recent strokes.   PAIN:  Are you having pain? No  PRECAUTIONS: Fall  RED FLAGS: None   WEIGHT BEARING RESTRICTIONS: No  FALLS: Has patient fallen in last 6 months? Yes. Number of falls 1, fell in the bedroom - no injuries, was able to get back up himself  LIVING ENVIRONMENT: Lives with: lives with their spouse Lives in: House/apartment Stairs: Yes: Internal: 12 steps; on right going up; wife provides Supervision on stairs Has following equipment at home: CPAP machine  PLOF: Independent  PATIENT GOALS: to get my balance better  OBJECTIVE:  Note: Objective measures were completed at Evaluation unless otherwise noted.  DIAGNOSTIC FINDINGS:  MRI Brain 08/24/2024 1. Acute infarct in the left subinsular region.  2. Possible small subacute infarct in the right basal ganglia.  3. Chronic microvascular ischemic changes.  4. Mild generalized parenchymal volume loss.     Carotid US  08/24/2024 Right Carotid: There is no evidence of stenosis in the right ICA.  Left Carotid: There is no evidence of stenosis in the left ICA.  Vertebrals:  Bilateral vertebral arteries demonstrate antegrade flow.  Subclavians: Normal flow hemodynamics were seen in bilateral subclavian arteries.    COGNITION: Overall cognitive status: Within functional limits for tasks assessed   SENSATION: No N/T Light touch WFL  COORDINATION: Impaired in BUE and BLE slightly  EDEMA:  R ankle swelling  POSTURE: rounded shoulders and forward head  LOWER EXTREMITY ROM:     Active  Right Eval Left Eval  Hip flexion    Hip extension    Hip abduction    Hip adduction    Hip internal rotation    Hip external rotation    Knee flexion    Knee extension Tight HS Tight HS  Ankle dorsiflexion    Ankle plantarflexion    Ankle inversion    Ankle eversion     (Blank rows = not tested)  LOWER EXTREMITY MMT:    MMT Right Eval Left Eval  Hip flexion 5 4  Hip extension    Hip abduction    Hip adduction    Hip internal rotation    Hip external rotation    Knee flexion 5 4  Knee extension 5 4  Ankle dorsiflexion 5 5  Ankle plantarflexion    Ankle inversion    Ankle eversion    (Blank rows = not tested)  BED MOBILITY:  Feels wobbly when he first stands up, otherwise mod I for bed mobility  TRANSFERS: Sit to stand: SBA  Assistive device utilized: None     Stand to sit: SBA  Assistive device utilized: None     Chair to chair: SBA  Assistive device utilized: None       RAMP:  Not tested  CURB:  Not tested  STAIRS: STAIRS:  Level of Assistance: CGA  Stair Negotiation Technique: Step to Pattern Alternating Pattern  with Bilateral Rails  Number of Stairs: 4   Height of Stairs: 6  Comments: alternating pattern ascending, step to pattern descending   GAIT: Findings:  GAIT: Gait pattern: scissoring, ataxic, and narrow BOS Distance walked: various clinic distances Assistive device utilized: None Level of assistance: CGA Comments: slowed gait speed  FUNCTIONAL TESTS:     mCTSIB: Condition  1: 30 sec Condition 2: 30 sec Condition 3: 30 sec Condition 4: 1 sec       =91/120                                                                                                                                TREATMENT DATE:    Self-Care/Home Management There were no vitals filed for this visit.  Sit to stand with EC: 10x, once instance of retropulsion noted where pt needed to stabilize by touching back of knees to table Picking up 10lb KB off 4 box: 10x, no reports of dizziness Pt educated on counting 5-10 sec before moving after he sits or stands to ensure he doesn't get light headed.  Resisted walking: black sport cord: 2 x 460 feet, intermittently letting go off resistance for few feet and asking patient to control the speed of walking, cues for arm swings.  Standing with narrow BOS on floor: Horizontal head turns, EO: 10x, Horizontal head turns, EC: 10x     PATIENT EDUCATION: Education details: see above under self-care, initial HEP Person educated: Patient and Spouse Education method: Explanation, Demonstration, Tactile cues, Verbal cues, and Handouts Education comprehension: verbalized understanding, returned demonstration, and needs further education  HOME EXERCISE PROGRAM: Access Code: [email or web address] URL: [email or web address] Date: 09/25/2024 Prepared by: Waddell Southgate  Exercises - Sit to Stand Without Arm Support  - 1 x daily - 7 x weekly - 3 sets - 10 reps - Staggered Sit-to-Stand  - 1 x daily - 7 x weekly - 3 sets - 10 reps - Squat with Chair Touch  - 1 x daily - 7 x weekly - 3 sets - 10 reps - Side Stepping with Resistance at Ankles and Counter Support  - 1 x daily - 7 x weekly - 3 sets - 10 reps  GOALS: Goals reviewed with patient? Yes  SHORT TERM GOALS: Target date: 10/08/2024   Pt will be independent with initial HEP for improved strength, balance, transfers and gait. Baseline: Goal status: INITIAL  2.  Pt will improve FGA to 12/30 for decreased fall risk  Baseline: 8/30 (11/17) Goal status: INITIAL  3.  Pt will improve Condition 4 on mCTSIB to 15 sec for improved balance Baseline: 1 sec (11/17) Goal status:  INITIAL   LONG TERM GOALS: Target date: 10/31/2024   Pt will be independent with final HEP for improved strength, balance, transfers and gait. Baseline:  Goal status: INITIAL  2.  Pt will improve 5 x STS to less than or equal to 24 seconds to demonstrate improved functional strength and transfer efficiency.  Baseline: 27 sec no UE (11/17) Goal status: INITIAL  3.  Pt will improve gait velocity to at least 2.25 ft/sec for improved gait efficiency and performance at mod I level  Baseline: 1.97 ft/sec SBA (11/17) Goal status: INITIAL  4.  Pt will improve normal TUG to less than or equal to 15 seconds for  improved functional mobility and decreased fall risk. Baseline: 18.69 sec no AD (111/7) Goal status: INITIAL  5.  Pt will improve FGA to 16/30 for decreased fall risk  Baseline: 8/30 (11/17) Goal status: INITIAL  6.  Pt will improve Condition 4 on mCTSIB to 30 sec for improved balance Baseline: 1 sec (11/17) Goal status: INITIAL  ASSESSMENT:  CLINICAL IMPRESSION: Emphasis placed on static balance with EO and EC and improving gait mechanics. Pt demo notable improvement in his cadence at end of the session.    OBJECTIVE IMPAIRMENTS: Abnormal gait, decreased balance, decreased coordination, difficulty walking, decreased strength, impaired perceived functional ability, and impaired vision/preception.   ACTIVITY LIMITATIONS: carrying, lifting, bending, squatting, stairs, and bed mobility  PARTICIPATION LIMITATIONS: meal prep, cleaning, laundry, medication management, interpersonal relationship, driving, and community activity  PERSONAL FACTORS: Age and 3+ comorbidities:   migraines, sleep apnea, hypertension, depressionare also affecting patient's functional outcome.   REHAB POTENTIAL: Good  CLINICAL DECISION MAKING: Evolving/moderate complexity  EVALUATION COMPLEXITY: Moderate  PLAN:  PT FREQUENCY: 2x/week  PT DURATION: 6 weeks  PLANNED INTERVENTIONS: 97164- PT  Re-evaluation, 97750- Physical Performance Testing, 97110-Therapeutic exercises, 97530- Therapeutic activity, [id number]- Neuromuscular re-education, 97535- Self Care, 02859- Manual therapy, [id number]- Gait training, [phone]- Orthotic Initial, [phone]- Orthotic/Prosthetic subsequent, [phone]- Electrical stimulation (manual), [phone] (1-2 muscles), 20561 (3+ muscles)- Dry Needling, Patient/Family education, Balance training, Stair training, Taping, Joint mobilization, Spinal mobilization, Visual/preceptual remediation/compensation, Cognitive remediation, DME instructions, Cryotherapy, and Moist heat  PLAN FOR NEXT SESSION: Progress static and dynamic balance as able,  Add to HEP PRN: based on FGA (head turns, obstacles, tandem, EC, backwards) pt scissors, narrow BOS, hip abd strengthening?; rockerboard, sit to stand on wedge, posterior LOB, ankle strategy   Raj LOISE Blanch, PT Waddell Southgate, PT, DPT, CSRS  10/11/2024, 9:18 AM

## 2024-10-16 ENCOUNTER — Ambulatory Visit

## 2024-10-16 ENCOUNTER — Ambulatory Visit: Admitting: Physical Therapy

## 2024-10-16 VITALS — BP 120/69 | HR 72

## 2024-10-16 DIAGNOSIS — R2689 Other abnormalities of gait and mobility: Secondary | ICD-10-CM | POA: Diagnosis not present

## 2024-10-16 DIAGNOSIS — R4701 Aphasia: Secondary | ICD-10-CM

## 2024-10-16 DIAGNOSIS — R41841 Cognitive communication deficit: Secondary | ICD-10-CM

## 2024-10-16 DIAGNOSIS — R278 Other lack of coordination: Secondary | ICD-10-CM

## 2024-10-16 DIAGNOSIS — M6281 Muscle weakness (generalized): Secondary | ICD-10-CM

## 2024-10-16 DIAGNOSIS — R2681 Unsteadiness on feet: Secondary | ICD-10-CM

## 2024-10-16 NOTE — Therapy (Signed)
 OUTPATIENT PHYSICAL THERAPY NEURO TREATMENT   Patient Name: [name] Brown MRN: [id number] DOB:07-Aug-1951, 73 y.o., male Today's Date: 10/16/2024   PCP: [name] Bottcher, NP REFERRING PROVIDER: Cristopher Bottcher, NP  END OF SESSION:  PT End of Session - 10/16/24 0931     Visit Number 4    Number of Visits 13   with eval   Date for Recertification  11/12/24   to allow for scheduling delays   Authorization Type Medicare    PT Start Time 0930    PT Stop Time 1012    PT Time Calculation (min) 42 min    Equipment Utilized During Treatment Gait belt    Activity Tolerance Patient tolerated treatment well    Behavior During Therapy WFL for tasks assessed/performed            Past Medical History:  Diagnosis Date   Allergic rhinitis    Asthma    Depressed    Headache    migraines   Hepatitis    PMH: HEP A   Hypertension    Kidney stones    Obesity (BMI 30-39.9)    OSA on CPAP    Pneumonia    Restless legs syndrome (RLS)    Past Surgical History:  Procedure Laterality Date   COLONOSCOPY W/ BIOPSIES AND POLYPECTOMY     DISTAL BICEPS TENDON REPAIR Right 08/22/2014   Procedure: RIGHT DISTAL BICEPS TENDON DIRECT PRIMARY REPAIR;  Surgeon: [name] CINDERELLA Poli, MD;  Location: [name];  Service: Orthopedics;  Laterality: Right;   Patient Active Problem List   Diagnosis Date Noted   Traumatic rupture of right distal biceps tendon 08/22/2014   HTN (hypertension) 09/05/2013   Obesity (BMI 30-39.9)    Restless legs syndrome (RLS)    Obstructive sleep apnea 08/25/2011   Seasonal and perennial allergic rhinitis 08/21/2007   Mild intermittent asthma in adult without complication 08/21/2007    ONSET DATE: 09/11/2024 (referral date)  REFERRING DIAG: R26.89 (ICD-10-CM) - Other abnormalities of gait and mobility I63.81 (ICD-10-CM) - 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 Pt's wife feels like his physical function has improved significantly. Pt feels like yesterday he was leaning to his L, but denies any falls. He reports mild pain in R knee (history of chronic knee issues) - has  a brace that he needs to wear.  Pt only does his HEP if his wife reminds him due to [facility] issues.   Pt accompanied by: significant other wife [name] Brown  PERTINENT HISTORY: [name] Brown is a 73 year old male with a history of migraines, sleep apnea, hypertension, depression who presents with vision changes and balance issues, had a recent MRI showing recent strokes.   PAIN:  Are you having pain? No  PRECAUTIONS: Fall  RED FLAGS: None   WEIGHT BEARING RESTRICTIONS: No  FALLS: Has patient fallen in last 6 months? Yes. Number of falls 1, fell in the bedroom - no injuries, was able to get back up himself  LIVING ENVIRONMENT: Lives with: lives with their spouse Lives in: House/apartment Stairs: Yes: Internal: 12 steps; on right going up; wife provides Supervision on stairs Has following equipment at home: CPAP machine  PLOF: Independent  PATIENT GOALS: to get my balance better  OBJECTIVE:  Note: Objective measures were completed at Evaluation unless otherwise noted.  DIAGNOSTIC FINDINGS:  MRI Brain 08/24/2024 1. Acute infarct in the left subinsular region.  2. Possible small subacute infarct in the right basal ganglia.  3. Chronic microvascular ischemic  changes.  4. Mild generalized parenchymal volume loss.     Carotid US  08/24/2024 Right Carotid: There is no evidence of stenosis in the right ICA.  Left Carotid: There is no evidence of stenosis in the left ICA.  Vertebrals:  Bilateral vertebral arteries demonstrate antegrade flow.  Subclavians: Normal flow hemodynamics were seen in bilateral subclavian arteries.   COGNITION: Overall cognitive status: Within functional limits for tasks assessed   SENSATION: No N/T Light touch WFL  COORDINATION: Impaired in BUE and BLE slightly  EDEMA:  R ankle swelling  POSTURE: rounded shoulders and forward head  LOWER EXTREMITY ROM:     Active  Right Eval Left Eval  Hip flexion    Hip extension    Hip abduction    Hip adduction    Hip internal rotation    Hip external rotation    Knee flexion    Knee extension Tight HS Tight HS  Ankle dorsiflexion    Ankle plantarflexion    Ankle inversion    Ankle eversion     (Blank rows = not tested)  LOWER EXTREMITY MMT:    MMT Right Eval Left Eval  Hip flexion 5 4  Hip extension    Hip abduction    Hip adduction    Hip internal rotation    Hip external rotation    Knee flexion 5 4  Knee extension 5 4  Ankle dorsiflexion 5 5  Ankle plantarflexion    Ankle inversion    Ankle eversion    (Blank rows = not tested)  BED MOBILITY:  Feels wobbly when he first stands up, otherwise mod I for bed mobility  TRANSFERS: Sit to stand: SBA  Assistive device utilized: None     Stand to sit: SBA  Assistive device utilized: None     Chair to chair: SBA  Assistive device utilized: None       RAMP:  Not tested  CURB:  Not tested  STAIRS: STAIRS:  Level of Assistance: CGA  Stair  Negotiation Technique: Step to Pattern Alternating Pattern  with Bilateral Rails  Number of Stairs: 4   Height of Stairs: 6  Comments: alternating pattern ascending, step to pattern descending   GAIT: Findings:  GAIT: Gait pattern: scissoring, ataxic,  and narrow BOS Distance walked: various clinic distances Assistive device utilized: None Level of assistance: CGA Comments: slowed gait speed  FUNCTIONAL TESTS:    [facility] PT Assessment - 10/16/24 0951       Functional Gait  Assessment   Gait assessed  Yes    Gait Level Surface Walks 20 ft, slow speed, abnormal gait pattern, evidence for imbalance Brown deviates 10-15 in outside of the 12 in walkway width. Requires more than 7 sec to ambulate 20 ft.    Change in Gait Speed Able to change speed, demonstrates mild gait deviations, deviates 6-10 in outside of the 12 in walkway width, Brown no gait deviations, unable to achieve a major change in velocity, Brown uses a change in velocity, Brown uses an assistive device.    Gait with Horizontal Head Turns Performs head turns smoothly with slight change in gait velocity (eg, minor disruption to smooth gait path), deviates 6-10 in outside 12 in walkway width, Brown uses an assistive device.    Gait with Vertical Head Turns Performs head turns with no change in gait. Deviates no more than 6 in outside 12 in walkway width.    Gait and Pivot Turn Pivot turns safely within 3 sec and stops quickly with no loss of balance.    Step Over Obstacle Is able to step over one shoe box (4.5 in total height) without changing gait speed. No evidence of imbalance.    Gait with Narrow Base of Support Ambulates less than 4 steps heel to toe Brown cannot perform without assistance.    Gait with Eyes Closed Cannot walk 20 ft without assistance, severe gait deviations Brown imbalance, deviates greater than 15 in outside 12 in walkway width Brown will not attempt task.    Ambulating Backwards Walks 20 ft, uses assistive device, slower speed, mild gait deviations, deviates 6-10 in outside 12 in walkway width.    Steps Alternating feet, must use rail.    Total Score 17    FGA comment: 17/30, high fall risk          mCTSIB: Condition 1: 30 sec Condition 2: 30 sec Condition 3: 30 sec Condition  4: 1 sec       =91/120                                                                                                                               TREATMENT DATE:    Self-Care/Home Management Vitals:   10/16/24 0941  BP: 120/69  Pulse: 72   Discussed continuing to monitor his BP at home and provided a BP log and chart for BP ranges. Educated patient that if his BP gets above 180/100 he would need  to tell his wife and seek immediate medical care. Pt's vitals currently WNL and his reading at home earlier today was WNL. Pt and wife with good understanding of education provided.  TherAct Corner balance normal stance on airex and pillows 3 x 30 sec each Tends to lose balance posteriorly Added to HEP, see bolded below To work on initial sit to stand balance and reducing retropulsion: Sit to stand to blue wedge under feet 3 x 10 reps from progressively lower mat table Several posterior LOB but able to catch himself to recover  Physical Performance For STG assessment:  [facility] PT Assessment - 10/16/24 0951       Functional Gait  Assessment   Gait assessed  Yes    Gait Level Surface Walks 20 ft, slow speed, abnormal gait pattern, evidence for imbalance Brown deviates 10-15 in outside of the 12 in walkway width. Requires more than 7 sec to ambulate 20 ft.    Change in Gait Speed Able to change speed, demonstrates mild gait deviations, deviates 6-10 in outside of the 12 in walkway width, Brown no gait deviations, unable to achieve a major change in velocity, Brown uses a change in velocity, Brown uses an assistive device.    Gait with Horizontal Head Turns Performs head turns smoothly with slight change in gait velocity (eg, minor disruption to smooth gait path), deviates 6-10 in outside 12 in walkway width, Brown uses an assistive device.    Gait with Vertical Head Turns Performs head turns with no change in gait. Deviates no more than 6 in outside 12 in walkway width.    Gait and Pivot Turn Pivot turns  safely within 3 sec and stops quickly with no loss of balance.    Step Over Obstacle Is able to step over one shoe box (4.5 in total height) without changing gait speed. No evidence of imbalance.    Gait with Narrow Base of Support Ambulates less than 4 steps heel to toe Brown cannot perform without assistance.    Gait with Eyes Closed Cannot walk 20 ft without assistance, severe gait deviations Brown imbalance, deviates greater than 15 in outside 12 in walkway width Brown will not attempt task.    Ambulating Backwards Walks 20 ft, uses assistive device, slower speed, mild gait deviations, deviates 6-10 in outside 12 in walkway width.    Steps Alternating feet, must use rail.    Total Score 17    FGA comment: 17/30, high fall risk         mCTSIB Condition 4: 0 sec, 5 sec, 30 sec with good use of ankle strategy by 3rd rep, 11.6 sec average score    PATIENT EDUCATION: Education details: see above under self-care regarding BP, continue HEP and added to HEP, results of OM and functional implications Person educated: Patient and Spouse Education method: Explanation, Demonstration, Tactile cues, Verbal cues, and Handouts Education comprehension: verbalized understanding, returned demonstration, and needs further education  HOME EXERCISE PROGRAM: Access Code: [email or web address] URL: [email or web address] Date: 09/25/2024 Prepared by: Waddell Southgate  Exercises - Sit to Stand Without Arm Support  - 1 x daily - 7 x weekly - 3 sets - 10 reps - Staggered Sit-to-Stand  - 1 x daily - 7 x weekly - 3 sets - 10 reps - Squat with Chair Touch  - 1 x daily - 7 x weekly - 3 sets - 10 reps - Side Stepping with Resistance at Ankles and Counter Support  - 1 x daily - 7 x  weekly - 3 sets - 10 reps - Standing Balance with Eyes Closed on Foam  - 1 x daily - 7 x weekly - 1 sets - 3-5 reps - 30 sec hold   GOALS: Goals reviewed with patient? Yes  SHORT TERM GOALS: Target date: 10/08/2024   Pt will be  independent with initial HEP for improved strength, balance, transfers and gait. Baseline: pt needs reminders from his wife to complete due to STM deficits (12/16) Goal status: IN PROGRESS  2.  Pt will improve FGA to 12/30 for decreased fall risk  Baseline: 8/30 (11/17), 17/30 (12/16) Goal status: MET  3.  Pt will improve Condition 4 on mCTSIB to 15 sec for improved balance Baseline: 1 sec (11/17), 11.6 sec average (12/16) Goal status: IN PROGRESS   LONG TERM GOALS: Target date: 10/31/2024   Pt will be independent with final HEP for improved strength, balance, transfers and gait. Baseline:  Goal status: INITIAL  2.  Pt will improve 5 x STS to less than Brown equal to 24 seconds to demonstrate improved functional strength and transfer efficiency.  Baseline: 27 sec no UE (11/17) Goal status: INITIAL  3.  Pt will improve gait velocity to at least 2.25 ft/sec for improved gait efficiency and performance at mod I level  Baseline: 1.97 ft/sec SBA (11/17) Goal status: INITIAL  4.  Pt will improve normal TUG to less than Brown equal to 15 seconds for improved functional mobility and decreased fall risk. Baseline: 18.69 sec no AD (111/7) Goal status: INITIAL  5.  Pt will improve FGA to 21/30 for decreased fall risk  Baseline: 8/30 (11/17), 17/30 (12/16) Goal status: INITIAL  6.  Pt will improve Condition 4 on mCTSIB to 30 sec for improved balance Baseline: 1 sec (11/17), 11.6 sec average (12/16) Goal status: INITIAL  ASSESSMENT:  CLINICAL IMPRESSION: Emphasis of skilled PT session on assessing STG, educating pt and his wife on BP monitoring and normal readings, and continuing to work on ankle strategy for balance recovery. Pt has met 1/3 STG due to improving his FGA score significantly as compared to initial evaluation, though he still remains a high fall risk. He continues to have the most difficulty balance with his EC and with narrow/tandem BOS. He is making progress towards his HEP  goal and improved his time on Condition 4 of the mCTSIB, however due to short term memory impairments he does have difficulty remembering to do his HEP and has to get reminders from his wife. Pt did have his Speech Therapy evaluation this date as well. He also improved his time on Condition 4 of the mCTSIB though he does continue to have posterior LOB and is not always able to catch himself to prevent a fall. Added standing balance on pillows with EC to his HEP and encouraged him to work on this at home with Supervision. He continues to benefit from skilled PT services to work towards increasing his safety and independence with functional mobility. Continue POC.     OBJECTIVE IMPAIRMENTS: Abnormal gait, decreased balance, decreased coordination, difficulty walking, decreased strength, impaired perceived functional ability, and impaired vision/preception.   ACTIVITY LIMITATIONS: carrying, lifting, bending, squatting, stairs, and bed mobility  PARTICIPATION LIMITATIONS: meal prep, cleaning, laundry, medication management, interpersonal relationship, driving, and community activity  PERSONAL FACTORS: Age and 3+ comorbidities:   migraines, sleep apnea, hypertension, depressionare also affecting patient's functional outcome.   REHAB POTENTIAL: Good  CLINICAL DECISION MAKING: Evolving/moderate complexity  EVALUATION COMPLEXITY: Moderate  PLAN:  PT FREQUENCY: 2x/week  PT DURATION: 6 weeks  PLANNED INTERVENTIONS: 97164- PT Re-evaluation, 97750- Physical Performance Testing, 97110-Therapeutic exercises, 97530- Therapeutic activity, [id number]- Neuromuscular re-education, 97535- Self Care, [id number]- Manual therapy, [phone]- Gait training, [phone]- Orthotic Initial, [phone]- Orthotic/Prosthetic subsequent, [phone]- Electrical stimulation (manual), [phone] (1-2 muscles), [phone] (3+ muscles)- Dry Needling, Patient/Family education, Balance training, Stair training, Taping, Joint mobilization, Spinal mobilization,  Visual/preceptual remediation/compensation, Cognitive remediation, DME instructions, Cryotherapy, and Moist heat  PLAN FOR NEXT SESSION: Progress static and dynamic balance as able,  Add to HEP PRN: based on FGA (head turns, obstacles, tandem, EC, backwards) pt scissors, narrow BOS, hip abd strengthening?; rockerboard, sit to stand on wedge, posterior LOB, ankle strategy, balance with EC, narrow BOS, compliant surface   Waddell Southgate, PT Waddell Southgate, PT, DPT, CSRS  10/16/2024, 10:13 AM

## 2024-10-16 NOTE — Therapy (Signed)
 OUTPATIENT SPEECH LANGUAGE PATHOLOGY APHASIA EVALUATION   Patient Name: [name] Brown MRN: [id number] DOB:04-14-1951, 73 y.o., male Today's Date: 10/16/2024  PCP: Arloa Elsie SAUNDERS, MD REFERRING PROVIDER: Cristopher Bottcher, NP  END OF SESSION:  End of Session - 10/16/24 1059     Visit Number 1    Number of Visits 17    Date for Recertification  12/11/24    SLP Start Time 1015    SLP Stop Time  1101    SLP Time Calculation (min) 46 min    Activity Tolerance Patient tolerated treatment well          Past Medical History:  Diagnosis Date   Allergic rhinitis    Asthma    Depressed    Headache    migraines   Hepatitis    PMH: HEP A   Hypertension    Kidney stones    Obesity (BMI 30-39.9)    OSA on CPAP    Pneumonia    Restless legs syndrome (RLS)    Past Surgical History:  Procedure Laterality Date   COLONOSCOPY W/ BIOPSIES AND POLYPECTOMY     DISTAL BICEPS TENDON REPAIR Right 08/22/2014   Procedure: RIGHT DISTAL BICEPS TENDON DIRECT PRIMARY REPAIR;  Surgeon: Lonni CINDERELLA Poli, MD;  Location: [facility] OR;  Service: Orthopedics;  Laterality: Right;   Patient Active Problem List   Diagnosis Date Noted   Traumatic rupture of right distal biceps tendon 08/22/2014   HTN (hypertension) 09/05/2013   Obesity (BMI 30-39.9)    Restless legs syndrome (RLS)    Obstructive sleep apnea 08/25/2011   Seasonal and perennial allergic rhinitis 08/21/2007   Mild intermittent asthma in adult without complication 08/21/2007    ONSET DATE: 09/11/2024 (referral date)   REFERRING DIAG:  R41.3 (ICD-10-CM) - Other amnesia  R47.01 (ICD-10-CM) - Aphasia  R26.89 (ICD-10-CM) - Other abnormalities of gait and mobility  I63.81 (ICD-10-CM) - 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 Pt accompanied by: self and significant other  PERTINENT HISTORY: DAVIT VASSAR is a 73 year old male with a history of migraines, sleep apnea, hypertension, depression who presents with vision changes and balance issues, had a recent MRI showing recent strokes. Pt note that he will have thoughts and sometimes getting those thoughts out does not work. The flow of commu. 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 Pt notes that he golfs frequently with friends, which is major communication opportunity for pt. Pt reads currently; however, he reads less now compared to prior to strokes. Pt's wife notes that pt's memory has changed since strokes. She states that her and pt have started implementing memory strategies (making a designated locations for specific medications/certain items). Pt's wife note that pt's swallowing with pills is a bit more labored. However, swallowing is not a major concern for pt at this time. (Clinical swallow evaluation may be completed in upcoming session to check overall status).   PAIN:  Are you having pain? No  FALLS: Has patient fallen in last 6 months?  Yes Pt fell twice in the past month.   LIVING ENVIRONMENT: Lives with: lives with their family Lives in: House/apartment  PLOF:  Level of assistance: Independent with ADLs Employment: Retired Since 2020.   PATIENT GOALS: To improve communication, reading, and  short-term memory skills.   OBJECTIVE:  Note: Objective measures were completed at Evaluation unless otherwise noted.  DIAGNOSTIC FINDINGS: FINDINGS:   CT HEAD: BRAIN: Small left insula infarct on MRI last month is occult by CT. More chronic appearing bilateral basal ganglia heterogeneity.   New hypodensity in the medial left deep gray nuclei outer and near the genu of the internal capsule (series 3 image 16). Similar confluent hypodensity at the anterior right corona radiata is also new on series 3 image  19.   No associated acute intracranial hemorrhage or mass effect. No mass Lesion. No midline shift or extra-axial collection. No evidence of acute cortically based infarct.   VENTRICLES: No hydrocephalus.   ORBITS: The orbits are unremarkable.   SINUSES AND MASTOIDS: The paranasal sinuses and mastoid air cells are clear.   CTA HEAD: INTERNAL CAROTID ARTERIES: Distal cervical ICAs are patent at the skull base. Both ICA siphons are patent. Left ICA siphon is mildly to moderately tortuous with no significant plaque or stenosis. Normal left posterior communicating artery origin. Similar mild to moderate tortuosity of the right ICA siphon with no plaque or stenosis. Normal right posterior communicating artery origin. The intracranial ICAs are patent with no significant stenosis. No occlusion. No aneurysm.   ANTERIOR CEREBRAL ARTERIES: Normal ACA origins. Diminutive or absent anterior communicating artery. Bilateral ACA branches are within normal limits. No significant stenosis. No occlusion. No aneurysm.   MIDDLE CEREBRAL ARTERIES: Normal MCA origins. Bilateral MCA M1 segments appear patent and normal. Left MCA bifurcation and right MCA trifurcation appear patent and normal. Bilateral MCA branches are within normal limits. No significant stenosis. No occlusion. No aneurysm.   POSTERIOR CEREBRAL ARTERIES: Fetal type bilateral PCA origins, normal variant. Bilateral PCA branches appear normal. No significant stenosis. No occlusion. No aneurysm.   BASILAR ARTERY: Patent basilar artery is diminutive, but without stenosis. Basilar tip and SCA origins are within normal limits. No occlusion. No aneurysm.   VERTEBRAL ARTERIES: Visible distal vertebral arteries and vertebrobasilar junction are patent. Distal right vertebral artery appears mildly dominant and tortuous. Patent PICA and/or AICA are visible. No significant stenosis. No occlusion. No aneurysm.   OTHER: Major dural venous  sinuses are enhancing and appear to be patent.   SOFT TISSUES: No acute finding. No masses or lymphadenopathy.   BONES: No acute osseous abnormality.   IMPRESSION: 1. Negative intracranial CTA; some generalized arterial tortuosity with No intracranial atherosclerosis or stenosis. 2. But positive for Progressive small vessel ischemia since the MRI last month; New lacunar type infarcts in both the left deep gray nuclei and the right corona radiata. No associated acute intracranial hemorrhage or mass effect.  COGNITION: Overall cognitive status: Impaired Areas of impairment:  Memory: Impaired: Working Short term Functional deficits: Pt is losing important items.  AUDITORY COMPREHENSION: Overall auditory comprehension: Appears intact YES/NO questions: Appears intact Following directions: Appears intact Conversation: Complex Interfering components: processing speed and working memory Effective technique: extra processing time  READING COMPREHENSION: Impaired: paragraph  EXPRESSION: verbal  VERBAL EXPRESSION: Level of generative/spontaneous verbalization: conversation Automatic speech: name: intact, social response: intact, and counting: intact  Repetition: Impaired: sentence Naming: Confrontation: 76-100% Pragmatics: Appears intact Comments: Pt notes that his verbal expression is slower than prior to strokes. 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  Interfering components: N/A Effective technique: To be explored in upcoming sessions. Non-verbal means of communication: N/A  WRITTEN EXPRESSION: Dominant hand: right Written expression: Appears intact  MOTOR SPEECH: Overall motor speech:  Appears intact   ORAL MOTOR EXAMINATION: Overall status: Did not assess Comments: N/A  STANDARDIZED ASSESSMENTS: QAB: Mild  PATIENT REPORTED OUTCOME MEASURES (PROM): Communication Participation Item Bank: 10/30 The Communicative Participation Item Bank         Does your condition interfere with... Pt Rating   ...talking with people you know 1   ...communicating when you need to say something quickly 1   ...talking with people you do not know 1   ...communicating when you are out in your community 1   ...asking questions in a conversation 1   ....communicating in a small group of people 1   ...having a long conversation 1   ...giving detailed infomrmation 1   ...getting your turn in a fast moving conversation 1   ...trying to persuade a friend or family member to see a different point of view 1  3= Not at all; 2=A little; 1=Quite a bit; 0=Very much                                                                                                                             TREATMENT DATE:   10/16/24: Evaluation in progress. Pt completed QAB and aphasia PROM. CLQT was initiated and will be completed in upcoming session, along with cognition PROM. No tx completed on this date.     PATIENT EDUCATION: Education details: Definition of aphasia and cognitive-communication disorders.  Person educated: Patient and Spouse Education method: Explanation and Handouts Education comprehension: verbalized understanding and needs further education   GOALS: Goals reviewed with patient? Yes  SHORT TERM GOALS: Target date: 11/12/24  Pt will complete CLQT+. Baseline: Goal status: INITIAL  2.  Pt will complete a PROM for cognition.  Baseline:  Goal status: INITIAL  3.  Pt will utilize circumlocution and other communication strategies for optimizing pt communication during a 12 minute conversation given occasional min A. Baseline:  Goal status: INITIAL  4.  Pt will read and comprehend a 4 paragraph text in <12 minutes using reading strategies (such as PQRST or other relevant alexia techniques) for improving pt QOL given occasional min A. Baseline:  Goal status: INITIAL  5.  Pt will demonstrate use of at least 1 external memory strategy (memory  journal, wall calendar, etc.) across 2-3 sessions given occasional min A.  Baseline:  Goal status: INITIAL  6.  Pt will complete clinical swallow evaluation (if indicated). Baseline:  Goal status: INITIAL  LONG TERM GOALS: Target date: 12/11/24  Pt will participate in complex conversation with <3 anomic pauses, using communication support strategies given rare min A. Baseline:  Goal status: INITIAL  2.  Pt will utilize at least 2 memory strategies for optimizing short-term memory recall given rare min A. Baseline:  Goal status: INITIAL  3.  Pt will comprehend moderately-complex texts pertinent to pt in 90% of opportunities given rare min A. Baseline:  Goal status: INITIAL  4.  Pt will improve score on CPIB. Baseline:  10/30 Goal status: INITIAL  5.  TBD Baseline:  Goal status: INITIAL  6.  TBD Baseline:  Goal status: INITIAL  ASSESSMENT:  CLINICAL IMPRESSION: Patient is a 73 y.o. male who was seen today for aphasia and cognitive-communication evaluation s/p multiple strokes. Pt presents with mild aphasia characterized by increased anomia and reduced language content according pt and pt's spouse. 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 Pt notes that communication is more labored and that he does not use as many words as he use to during conversations. Pt's reading skills have decreased compared to prior to the strokes according to pt. Pt states that he could use to read multiple paragraphs without thinking about it; however, pt now notices that he requires multiple re-readings to comprehend texts. Pt reads for fun. In addition to aphasia, pt presents with cognitive-communication deficits characterized by short-term memory impairment. Pt's spouse notes that pt's short-term memory recall is reduced compared ot prior to strokes. Pt also endorses this fact. CLQT is currently in progress and will provide more information about pt's  cognitive-communication skills.   ST services are recommended due to significant impacts of current deficits on pt's communication at home and in public. According to PROM, pt rated every prompt with a score of 1, which notes quite a bit of effort with using his language in multiple communication contexts. Additionally, pt needs ST services to optimize safety at home due to pt-reported short-term memory deficits. Without skilled services, pt is at risk of reduced safety and social engagement in multiple communication contexts, which all significantly impact QOL.  OBJECTIVE IMPAIRMENTS: include memory, expressive language, and aphasia. These impairments are limiting patient from ADLs/IADLs and effectively communicating at home and in community. Factors affecting potential to achieve goals and functional outcome are N/A. Patient will benefit from skilled SLP services to address above impairments and improve overall function.  REHAB POTENTIAL: Excellent  PLAN:  SLP FREQUENCY: 2x/week  SLP DURATION: 8 weeks  PLANNED INTERVENTIONS: Language facilitation, Environmental controls, Cognitive reorganization, Internal/external aids, Functional tasks, SLP instruction and feedback, Compensatory strategies, Patient/family education, and [id number] Treatment of speech (30 or 45 min)     [employer], CF-SLP 10/16/2024, 12:11 PM

## 2024-10-18 ENCOUNTER — Ambulatory Visit: Admitting: Physical Therapy

## 2024-10-18 VITALS — BP 131/76 | HR 84

## 2024-10-18 DIAGNOSIS — M6281 Muscle weakness (generalized): Secondary | ICD-10-CM

## 2024-10-18 DIAGNOSIS — I6381 Other cerebral infarction due to occlusion or stenosis of small artery: Secondary | ICD-10-CM

## 2024-10-18 DIAGNOSIS — R2689 Other abnormalities of gait and mobility: Secondary | ICD-10-CM | POA: Diagnosis not present

## 2024-10-18 DIAGNOSIS — R2681 Unsteadiness on feet: Secondary | ICD-10-CM

## 2024-10-18 NOTE — Therapy (Signed)
 OUTPATIENT PHYSICAL THERAPY NEURO TREATMENT   Patient Name: [name] Brown MRN: [id number] DOB:[birth year], 73 y.o., male Today's Date: 10/18/2024   PCP: Cristopher Bottcher, NP REFERRING PROVIDER: Cristopher Bottcher, NP  END OF SESSION:  PT End of Session - 10/18/24 0920     Visit Number 5    Number of Visits 13   with eval   Date for Recertification  11/12/24   to allow for scheduling delays   Authorization Type Medicare    PT Start Time 0920    PT Stop Time 0959    PT Time Calculation (min) 39 min    Equipment Utilized During Treatment Gait belt    Activity Tolerance Patient tolerated treatment well    Behavior During Therapy WFL for tasks assessed/performed             Past Medical History:  Diagnosis Date   Allergic rhinitis    Asthma    Depressed    Headache    migraines   Hepatitis    PMH: HEP A   Hypertension    Kidney stones    Obesity (BMI 30-39.9)    OSA on CPAP    Pneumonia    Restless legs syndrome (RLS)    Past Surgical History:  Procedure Laterality Date   COLONOSCOPY W/ BIOPSIES AND POLYPECTOMY     DISTAL BICEPS TENDON REPAIR Right 08/22/2014   Procedure: RIGHT DISTAL BICEPS TENDON DIRECT PRIMARY REPAIR;  Surgeon: Lonni CINDERELLA Poli, MD;  Location: [facility] OR;  Service: Orthopedics;  Laterality: Right;   Patient Active Problem List   Diagnosis Date Noted   Traumatic rupture of right distal biceps tendon 08/22/2014   HTN (hypertension) 09/05/2013   Obesity (BMI 30-39.9)    Restless legs syndrome (RLS)    Obstructive sleep apnea 08/25/2011   Seasonal and perennial allergic rhinitis 08/21/2007   Mild intermittent asthma in adult without complication 08/21/2007    ONSET DATE: 09/11/2024 (referral date)  REFERRING DIAG: R26.89 (ICD-10-CM) - Other abnormalities of gait and mobility I63.81 (ICD-10-CM) - 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  Pt continues to work on his HEP without reminders from his  wife this time! He has not added in new balance exercise yet. He has been checking his BP at home regularly as well.   Pt accompanied by: significant other wife [name] Brown  PERTINENT HISTORY: [name] Brown is a 73 year old male with a history of migraines, sleep apnea, hypertension, depression who presents with vision changes and balance issues, had a recent MRI showing recent strokes.   PAIN:  Are you having pain? No  PRECAUTIONS: Fall  RED FLAGS: None   WEIGHT BEARING RESTRICTIONS: No  FALLS: Has patient fallen in last 6 months? Yes. Number of falls 1, fell in the bedroom - no injuries, was able to get back up himself  LIVING ENVIRONMENT: Lives with: lives with their spouse Lives in: House/apartment Stairs: Yes: Internal: 12 steps; on right going up; wife provides Supervision on stairs Has following equipment at home: CPAP machine  PLOF: Independent  PATIENT GOALS: to get my balance better  OBJECTIVE:  Note: Objective measures were completed at Evaluation unless otherwise noted.  DIAGNOSTIC FINDINGS:  MRI Brain 08/24/2024 1. Acute infarct in the left subinsular region.  2. Possible small subacute infarct in the right basal ganglia.  3. Chronic microvascular ischemic changes.  4. Mild generalized parenchymal volume loss.     Carotid US  08/24/2024 Right Carotid: There is no evidence of stenosis in the right ICA.  Left Carotid: There is no evidence of stenosis in the left ICA.  Vertebrals:  Bilateral vertebral arteries demonstrate antegrade  flow.  Subclavians: Normal flow hemodynamics were seen in bilateral subclavian arteries.   COGNITION: Overall cognitive status: Within functional limits for tasks assessed   SENSATION: No N/T Light touch WFL  COORDINATION: Impaired in BUE and BLE slightly  EDEMA:  R ankle swelling  POSTURE: rounded shoulders and forward head  LOWER EXTREMITY ROM:     Active  Right Eval Left Eval  Hip flexion    Hip extension    Hip abduction    Hip adduction    Hip internal rotation    Hip external rotation    Knee flexion    Knee extension Tight HS Tight HS  Ankle dorsiflexion    Ankle plantarflexion    Ankle inversion    Ankle eversion     (Blank rows = not tested)  LOWER EXTREMITY MMT:    MMT Right Eval Left Eval  Hip flexion 5 4  Hip extension    Hip abduction    Hip adduction    Hip internal rotation    Hip external rotation    Knee flexion 5 4  Knee extension 5 4  Ankle dorsiflexion 5 5  Ankle plantarflexion    Ankle inversion    Ankle eversion    (Blank rows = not tested)  BED MOBILITY:  Feels wobbly when he first stands up, otherwise mod I for bed mobility  TRANSFERS: Sit to stand: SBA  Assistive device utilized: None     Stand to sit: SBA  Assistive device utilized: None     Chair to chair: SBA  Assistive device utilized: None       RAMP:  Not tested  CURB:  Not tested  STAIRS: STAIRS:  Level of Assistance: CGA  Stair Negotiation Technique: Step to Pattern Alternating Pattern  with Bilateral Rails  Number of Stairs: 4   Height of Stairs: 6  Comments: alternating pattern ascending, step to pattern descending   GAIT: Findings:  GAIT: Gait pattern: scissoring, ataxic, and narrow BOS Distance walked: various clinic  distances Assistive device utilized: None Level of assistance: CGA Comments: slowed gait speed  FUNCTIONAL TESTS:      mCTSIB: Condition 1: 30 sec Condition 2: 30 sec Condition 3: 30 sec Condition 4: 1 sec       =91/120                                                                                                                                TREATMENT DATE:    Self-Care/Home Management Vitals:   10/18/24 0926  BP: 131/76  Pulse: 84   Assessed in LUE at rest, vitals WNL. Encouraged him to continue to monitor it at home.  TherAct In // bars to work on ankle strategy: Static stance on rockerboard in A/P direction, iniitally with LOB posterirlu but improves Added in ball toss 3 x 15 reps Initially with one LOB but recovers and improved ankle strategy with progression Lateral sidestepping on blue foam beam 3 x 10 ft L/R Significant difficulty with this, frequent posterior LOB Added in fingertip support but then task is too easy Static stance on airex performing alt L/R gumdrop taps 3 x 10 reps Several posterior LOB by end of last set due to fatigue To work on initial sit to stand balance and reducing retropulsion: Sit to stand to blue wedge under feet 2 x 10 reps from elevated mat table Several posterior LOB but able to catch himself to recover during first set, improved control during second set     PATIENT EDUCATION: Education details: continue HEP Person educated: Patient and Spouse Education method: Explanation, Demonstration, Tactile cues, and Verbal cues Education comprehension: verbalized understanding, returned demonstration, and needs further education  HOME EXERCISE PROGRAM: Access Code: [email or web address] URL: [email or web address] Date: 09/25/2024 Prepared by: Waddell Southgate  Exercises - Sit to Stand Without Arm Support  - 1 x daily - 7 x weekly - 3 sets - 10 reps - Staggered Sit-to-Stand  - 1 x daily - 7 x weekly - 3 sets - 10 reps - Squat with Chair Touch  - 1 x daily - 7 x weekly - 3 sets - 10 reps - Side Stepping with Resistance at Ankles and Counter Support  - 1 x daily - 7 x weekly - 3 sets - 10 reps - Standing Balance with Eyes Closed on Foam  - 1 x daily - 7 x  weekly - 1 sets - 3-5 reps - 30 sec hold   GOALS: Goals reviewed with patient? Yes  SHORT TERM GOALS: Target date: 10/08/2024   Pt will be independent with initial HEP for improved strength, balance, transfers and gait. Baseline: pt needs reminders from his wife to complete due to STM deficits (12/16) Goal status: IN PROGRESS  2.  Pt will improve FGA to 12/30 for decreased fall risk  Baseline: 8/30 (11/17), 17/30 (12/16) Goal status: MET  3.  Pt will improve Condition 4 on mCTSIB to 15 sec for improved balance Baseline:  1 sec (11/17), 11.6 sec average (12/16) Goal status: IN PROGRESS   LONG TERM GOALS: Target date: 10/31/2024   Pt will be independent with final HEP for improved strength, balance, transfers and gait. Baseline:  Goal status: INITIAL  2.  Pt will improve 5 x STS to less than or equal to 24 seconds to demonstrate improved functional strength and transfer efficiency.  Baseline: 27 sec no UE (11/17) Goal status: INITIAL  3.  Pt will improve gait velocity to at least 2.25 ft/sec for improved gait efficiency and performance at mod I level  Baseline: 1.97 ft/sec SBA (11/17) Goal status: INITIAL  4.  Pt will improve normal TUG to less than or equal to 15 seconds for improved functional mobility and decreased fall risk. Baseline: 18.69 sec no AD (111/7) Goal status: INITIAL  5.  Pt will improve FGA to 21/30 for decreased fall risk  Baseline: 8/30 (11/17), 17/30 (12/16) Goal status: INITIAL  6.  Pt will improve Condition 4 on mCTSIB to 30 sec for improved balance Baseline: 1 sec (11/17), 11.6 sec average (12/16) Goal status: INITIAL  ASSESSMENT:  CLINICAL IMPRESSION: Emphasis of skilled PT session on working on addressing ankle strategy for balance recovery. Pt with improved performance of static stance on rockerboard and with sit to stands onto wedge as tasks progress with improved activation of LE musculature to prevent posterior LOB. He does struggle with  sidesteps on blue foam beam due to narrowness of beam. He also fatigues with static stance on airex with decreased balance noted as task progresses. He continues to benefit from skilled PT services to work towards increasing his safety and independence with functional mobility. Continue POC.     OBJECTIVE IMPAIRMENTS: Abnormal gait, decreased balance, decreased coordination, difficulty walking, decreased strength, impaired perceived functional ability, and impaired vision/preception.   ACTIVITY LIMITATIONS: carrying, lifting, bending, squatting, stairs, and bed mobility  PARTICIPATION LIMITATIONS: meal prep, cleaning, laundry, medication management, interpersonal relationship, driving, and community activity  PERSONAL FACTORS: Age and 3+ comorbidities:   migraines, sleep apnea, hypertension, depressionare also affecting patient's functional outcome.   REHAB POTENTIAL: Good  CLINICAL DECISION MAKING: Evolving/moderate complexity  EVALUATION COMPLEXITY: Moderate  PLAN:  PT FREQUENCY: 2x/week  PT DURATION: 6 weeks  PLANNED INTERVENTIONS: 97164- PT Re-evaluation, 97750- Physical Performance Testing, 97110-Therapeutic exercises, 97530- Therapeutic activity, [id number]- Neuromuscular re-education, 97535- Self Care, [id number]- Manual therapy, [id number]- Gait training, [phone]- Orthotic Initial, [phone]- Orthotic/Prosthetic subsequent, [phone]- Electrical stimulation (manual), [phone] (1-2 muscles), [phone] (3+ muscles)- Dry Needling, Patient/Family education, Balance training, Stair training, Taping, Joint mobilization, Spinal mobilization, Visual/preceptual remediation/compensation, Cognitive remediation, DME instructions, Cryotherapy, and Moist heat  PLAN FOR NEXT SESSION: Progress static and dynamic balance as able,  Add to HEP PRN: based on FGA (head turns, obstacles, tandem, EC, backwards) pt scissors, narrow BOS, hip abd strengthening?; rockerboard, sit to stand on wedge, posterior LOB, ankle strategy, balance  with EC, narrow BOS, compliant surface   Waddell Southgate, PT Waddell Southgate, PT, DPT, CSRS  10/18/2024, 9:59 AM

## 2024-10-22 ENCOUNTER — Ambulatory Visit

## 2024-10-24 ENCOUNTER — Ambulatory Visit: Admitting: Physical Therapy

## 2024-10-24 VITALS — BP 129/91 | HR 91

## 2024-10-24 DIAGNOSIS — R2681 Unsteadiness on feet: Secondary | ICD-10-CM

## 2024-10-24 DIAGNOSIS — M6281 Muscle weakness (generalized): Secondary | ICD-10-CM

## 2024-10-24 DIAGNOSIS — R2689 Other abnormalities of gait and mobility: Secondary | ICD-10-CM | POA: Diagnosis not present

## 2024-10-24 NOTE — Therapy (Signed)
 " OUTPATIENT PHYSICAL THERAPY NEURO TREATMENT   Patient Name: [name] Brown MRN: [id number] DOB:12-24-1950, 73 y.o., male Today's Date: 10/24/2024   PCP: Cristopher Bottcher, NP REFERRING PROVIDER: Cristopher Bottcher, NP  END OF SESSION:  PT End of Session - 10/24/24 0933     Visit Number 6    Number of Visits 13   with eval   Date for Recertification  11/12/24   to allow for scheduling delays   Authorization Type Medicare    PT Start Time 0930    PT Stop Time 1014    PT Time Calculation (min) 44 min    Equipment Utilized During Treatment Gait belt    Activity Tolerance Patient tolerated treatment well    Behavior During Therapy WFL for tasks assessed/performed              Past Medical History:  Diagnosis Date   Allergic rhinitis    Asthma    Depressed    Headache    migraines   Hepatitis    PMH: HEP A   Hypertension    Kidney stones    Obesity (BMI 30-39.9)    OSA on CPAP    Pneumonia    Restless legs syndrome (RLS)    Past Surgical History:  Procedure Laterality Date   COLONOSCOPY W/ BIOPSIES AND POLYPECTOMY     DISTAL BICEPS TENDON REPAIR Right 08/22/2014   Procedure: RIGHT DISTAL BICEPS TENDON DIRECT PRIMARY REPAIR;  Surgeon: Lonni CINDERELLA Poli, MD;  Location: [facility] OR;  Service: Orthopedics;  Laterality: Right;   Patient Active Problem List   Diagnosis Date Noted   Traumatic rupture of right distal biceps tendon 08/22/2014   HTN (hypertension) 09/05/2013   Obesity (BMI 30-39.9)    Restless legs syndrome (RLS)    Obstructive sleep apnea 08/25/2011   Seasonal and perennial allergic rhinitis 08/21/2007   Mild intermittent asthma in adult without complication 08/21/2007    ONSET DATE: 09/11/2024 (referral date)  REFERRING DIAG: R26.89 (ICD-10-CM) - Other abnormalities of gait and mobility I63.81 (ICD-10-CM) - 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 He had lunch with an old friend yesterday, his first  outing/socialization since his stroke. He was nervous at first but it went well.  Pt accompanied by: significant other wife [name]  PERTINENT HISTORY: [name] Brown is a 73 year old male with a history of migraines, sleep apnea, hypertension, depression who presents with vision changes and balance issues, had a recent MRI showing recent strokes.   PAIN:  Are you having pain? No  PRECAUTIONS: Fall  RED FLAGS: None   WEIGHT BEARING RESTRICTIONS: No  FALLS: Has patient fallen in last 6 months? Yes. Number of falls 1, fell in the bedroom - no injuries, was able to get back up himself  LIVING ENVIRONMENT: Lives with: lives with their spouse Lives in: House/apartment Stairs: Yes: Internal: 12 steps; on right going up; wife provides Supervision on stairs Has following equipment at home: CPAP machine  PLOF: Independent  PATIENT GOALS: to get my balance better  OBJECTIVE:  Note: Objective measures were completed at Evaluation unless otherwise noted.  DIAGNOSTIC FINDINGS:  MRI Brain 08/24/2024 1. Acute infarct in the left subinsular region.  2. Possible small subacute infarct in the right basal ganglia.  3. Chronic microvascular ischemic changes.  4. Mild generalized parenchymal volume loss.     Carotid US  08/24/2024 Right Carotid: There is no evidence of stenosis in the right ICA.  Left Carotid: There is no evidence of stenosis in the left ICA.  Vertebrals:  Bilateral vertebral arteries demonstrate antegrade flow.  Subclavians: Normal flow  hemodynamics were seen in bilateral subclavian arteries.   COGNITION: Overall cognitive status: Within functional limits for tasks assessed   SENSATION: No N/T Light touch WFL  COORDINATION: Impaired in BUE and BLE slightly  EDEMA:  R ankle swelling  POSTURE: rounded shoulders and forward head  LOWER EXTREMITY ROM:     Active  Right Eval Left Eval  Hip flexion    Hip extension    Hip abduction    Hip adduction    Hip internal rotation    Hip external rotation    Knee flexion    Knee extension Tight HS Tight HS  Ankle dorsiflexion    Ankle plantarflexion    Ankle inversion    Ankle eversion     (Blank rows = not tested)  LOWER EXTREMITY MMT:    MMT Right Eval Left Eval  Hip flexion 5 4  Hip extension    Hip abduction    Hip adduction    Hip internal rotation    Hip external rotation    Knee flexion 5 4  Knee extension 5 4  Ankle dorsiflexion 5 5  Ankle plantarflexion    Ankle inversion    Ankle eversion    (Blank rows = not tested)  BED MOBILITY:  Feels wobbly when he first stands up, otherwise mod I for bed mobility  TRANSFERS: Sit to stand: SBA  Assistive device utilized: None     Stand to sit: SBA  Assistive device utilized: None     Chair to chair: SBA  Assistive device utilized: None       RAMP:  Not tested  CURB:  Not tested  STAIRS: STAIRS:  Level of Assistance: CGA  Stair Negotiation Technique: Step to Pattern Alternating Pattern  with Bilateral Rails  Number of Stairs: 4   Height of Stairs: 6  Comments: alternating pattern ascending, step to pattern descending   GAIT: Findings:  GAIT: Gait pattern: scissoring, ataxic, and narrow BOS Distance walked: various clinic distances Assistive device utilized: None Level of assistance: CGA Comments: slowed  gait speed  FUNCTIONAL TESTS:      mCTSIB: Condition 1: 30 sec Condition 2: 30 sec Condition 3: 30 sec Condition 4: 1 sec       =91/120                                                                                                                                TREATMENT DATE:    Self-Care/Home Management Vitals:   10/24/24 0940  BP: (!) 129/91  Pulse: 91    Assessed in LUE at rest, BP slightly elevated. Encouraged him to continue to monitor it at home.  TherAct To work on reactive balance and stepping strategy: Resisted gait, steady resistance x 115 ft with black band Added in multidirectional perturbations 2 x 115 ft Initially with cross over stepping strategy, with cues utilizes stepping out strategy more Resisted gait, multidirectional perturbations, with red band 2 x 200 ft Some instances of cross-over stepping strategy, some instances of stepping out strategy Red band resisted gumdrop taps Difficulty with SLS, R>L Stance on compliant surface (blue mat) performing alt L/R gumdrop taps More dificult with SLS on RLE as compared to LLE   PATIENT EDUCATION: Education details: continue HEP, will continue to focus on SLS on RLE as he struggle with that the most this visit Person educated: Patient and Spouse Education method: Explanation, Demonstration, Tactile cues, and Verbal cues Education comprehension: verbalized understanding, returned demonstration, and needs further education  HOME EXERCISE PROGRAM: Access Code: [email or web address] URL: [email or web address] Date: 09/25/2024 Prepared by: Waddell Southgate  Exercises - Sit to Stand Without Arm Support  - 1 x daily - 7 x weekly - 3 sets - 10 reps - Staggered Sit-to-Stand  - 1 x daily - 7 x weekly - 3 sets - 10 reps - Squat with Chair Touch  - 1 x daily - 7 x weekly - 3 sets - 10 reps - Side Stepping with Resistance at Ankles and Counter Support  - 1 x daily - 7 x weekly - 3 sets - 10 reps - Standing Balance with Eyes Closed on Foam  - 1 x daily - 7 x weekly - 1 sets - 3-5 reps - 30 sec hold   GOALS: Goals reviewed with patient? Yes  SHORT TERM GOALS: Target date:  10/08/2024   Pt will be independent with initial HEP for improved strength, balance, transfers and gait. Baseline: pt needs reminders from his wife to complete due to STM deficits (12/16) Goal status: IN PROGRESS  2.  Pt will improve FGA to 12/30 for decreased fall risk  Baseline: 8/30 (11/17), 17/30 (12/16) Goal status: MET  3.  Pt will improve Condition 4 on mCTSIB to 15 sec for improved balance Baseline: 1 sec (11/17), 11.6 sec average (12/16) Goal status: IN PROGRESS   LONG TERM GOALS: Target date: 11/08/2024 (updated to match last scheduled visit within POC)   Pt will be independent with final HEP for improved strength,  balance, transfers and gait. Baseline:  Goal status: INITIAL  2.  Pt will improve 5 x STS to less than or equal to 24 seconds to demonstrate improved functional strength and transfer efficiency.  Baseline: 27 sec no UE (11/17) Goal status: INITIAL  3.  Pt will improve gait velocity to at least 2.25 ft/sec for improved gait efficiency and performance at mod I level  Baseline: 1.97 ft/sec SBA (11/17) Goal status: INITIAL  4.  Pt will improve normal TUG to less than or equal to 15 seconds for improved functional mobility and decreased fall risk. Baseline: 18.69 sec no AD (111/7) Goal status: INITIAL  5.  Pt will improve FGA to 21/30 for decreased fall risk  Baseline: 8/30 (11/17), 17/30 (12/16) Goal status: INITIAL  6.  Pt will improve Condition 4 on mCTSIB to 30 sec for improved balance Baseline: 1 sec (11/17), 11.6 sec average (12/16) Goal status: INITIAL  ASSESSMENT:  CLINICAL IMPRESSION: Emphasis of skilled PT session on working on reactive balance, stepping strategy, and SLS stability. Pt tends to use cross over stepping strategy more than stepping out strategy initially, does improve following verbal cueing and education. He also struggles with SLS on RLE>LLE. He continues to benefit from skilled PT services to work towards increasing his safety and  independence with functional mobility. Continue POC.     OBJECTIVE IMPAIRMENTS: Abnormal gait, decreased balance, decreased coordination, difficulty walking, decreased strength, impaired perceived functional ability, and impaired vision/preception.   ACTIVITY LIMITATIONS: carrying, lifting, bending, squatting, stairs, and bed mobility  PARTICIPATION LIMITATIONS: meal prep, cleaning, laundry, medication management, interpersonal relationship, driving, and community activity  PERSONAL FACTORS: Age and 3+ comorbidities:   migraines, sleep apnea, hypertension, depressionare also affecting patient's functional outcome.   REHAB POTENTIAL: Good  CLINICAL DECISION MAKING: Evolving/moderate complexity  EVALUATION COMPLEXITY: Moderate  PLAN:  PT FREQUENCY: 2x/week  PT DURATION: 6 weeks  PLANNED INTERVENTIONS: 97164- PT Re-evaluation, 97750- Physical Performance Testing, 97110-Therapeutic exercises, 97530- Therapeutic activity, [id number]- Neuromuscular re-education, 97535- Self Care, 02859- Manual therapy, [id number]- Gait training, [phone]- Orthotic Initial, [phone]- Orthotic/Prosthetic subsequent, [phone]- Electrical stimulation (manual), [phone] (1-2 muscles), [phone] (3+ muscles)- Dry Needling, Patient/Family education, Balance training, Stair training, Taping, Joint mobilization, Spinal mobilization, Visual/preceptual remediation/compensation, Cognitive remediation, DME instructions, Cryotherapy, and Moist heat  PLAN FOR NEXT SESSION: Progress static and dynamic balance as able,  Add to HEP PRN: based on FGA (head turns, obstacles, tandem, EC, backwards) pt scissors, narrow BOS, hip abd strengthening?; rockerboard, sit to stand on wedge, posterior LOB, ankle strategy, balance with EC, narrow BOS, compliant surface, SLS on RLE for improved balance/stability, resisted dot taps   Waddell Southgate, PT Waddell Southgate, PT, DPT, CSRS  10/24/2024, 10:14 AM        "

## 2024-10-29 ENCOUNTER — Ambulatory Visit: Admitting: Physical Therapy

## 2024-10-29 ENCOUNTER — Ambulatory Visit

## 2024-10-29 VITALS — BP 127/76 | HR 77

## 2024-10-29 DIAGNOSIS — M6281 Muscle weakness (generalized): Secondary | ICD-10-CM

## 2024-10-29 DIAGNOSIS — R2689 Other abnormalities of gait and mobility: Secondary | ICD-10-CM

## 2024-10-29 DIAGNOSIS — R2681 Unsteadiness on feet: Secondary | ICD-10-CM

## 2024-10-29 DIAGNOSIS — R41841 Cognitive communication deficit: Secondary | ICD-10-CM

## 2024-10-29 DIAGNOSIS — R278 Other lack of coordination: Secondary | ICD-10-CM

## 2024-10-29 DIAGNOSIS — R4701 Aphasia: Secondary | ICD-10-CM

## 2024-10-29 NOTE — Therapy (Signed)
 " OUTPATIENT PHYSICAL THERAPY NEURO TREATMENT   Patient Name: [name] Brown MRN: [id number] DOB:[birth year], 73 y.o., male Today's Date: 10/29/2024   PCP: [name] Bottcher, NP REFERRING PROVIDER: Cristopher Bottcher, NP  END OF SESSION:  PT End of Session - 10/29/24 0936     Visit Number 7    Number of Visits 13   with eval   Date for Recertification  11/12/24   to allow for scheduling delays   Authorization Type Medicare    PT Start Time 0930    PT Stop Time 1015    PT Time Calculation (min) 45 min    Equipment Utilized During Treatment Gait belt    Activity Tolerance Patient tolerated treatment well    Behavior During Therapy WFL for tasks assessed/performed               Past Medical History:  Diagnosis Date   Allergic rhinitis    Asthma    Depressed    Headache    migraines   Hepatitis    PMH: HEP A   Hypertension    Kidney stones    Obesity (BMI 30-39.9)    OSA on CPAP    Pneumonia    Restless legs syndrome (RLS)    Past Surgical History:  Procedure Laterality Date   COLONOSCOPY W/ BIOPSIES AND POLYPECTOMY     DISTAL BICEPS TENDON REPAIR Right 08/22/2014   Procedure: RIGHT DISTAL BICEPS TENDON DIRECT PRIMARY REPAIR;  Surgeon: [name] CINDERELLA Poli, MD;  Location: [facility] OR;  Service: Orthopedics;  Laterality: Right;   Patient Active Problem List   Diagnosis Date Noted   Traumatic rupture of right distal biceps tendon 08/22/2014   HTN (hypertension) 09/05/2013   Obesity (BMI 30-39.9)    Restless legs syndrome (RLS)    Obstructive sleep apnea 08/25/2011   Seasonal and perennial allergic rhinitis 08/21/2007   Mild intermittent asthma in adult without complication 08/21/2007    ONSET DATE: 09/11/2024 (referral date)  REFERRING DIAG: R26.89 (ICD-10-CM) - Other abnormalities of gait and mobility I63.81 (ICD-10-CM) - 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  Pt accompanied by: significant other wife [name] Brown  PERTINENT HISTORY: [name] Brown is a 73 year old male with a history of migraines, sleep apnea, hypertension, depression who presents with vision changes and balance issues, had a recent MRI showing recent strokes.   PAIN:  Are you having pain? No  PRECAUTIONS: Fall  RED FLAGS: None   WEIGHT BEARING RESTRICTIONS: No  FALLS: Has patient fallen in last 6 months? Yes. Number of falls 1, fell in the bedroom - no injuries, was able to get back up himself  LIVING ENVIRONMENT: Lives with: lives with their spouse Lives in: House/apartment Stairs: Yes: Internal: 12 steps; on right going up; wife provides Supervision on stairs Has following equipment at home: CPAP machine  PLOF: Independent  PATIENT GOALS: to get my balance better  OBJECTIVE:  Note: Objective measures were completed at Evaluation unless otherwise noted.  DIAGNOSTIC FINDINGS:  MRI Brain 08/24/2024 1. Acute infarct in the left subinsular region.  2. Possible small subacute infarct in the right basal ganglia.  3. Chronic microvascular ischemic changes.  4. Mild generalized parenchymal volume loss.     Carotid US  08/24/2024 Right Carotid: There is no evidence of stenosis in the right ICA.  Left Carotid: There is no evidence of stenosis in the left ICA.  Vertebrals:  Bilateral vertebral arteries demonstrate antegrade flow.  Subclavians: Normal flow hemodynamics were seen in bilateral subclavian arteries.   COGNITION: Overall  cognitive status: Within functional limits for tasks assessed   SENSATION: No N/T Light touch WFL  COORDINATION: Impaired in BUE and BLE slightly  EDEMA:  R ankle swelling  POSTURE: rounded shoulders and forward head  LOWER EXTREMITY ROM:     Active  Right Eval Left Eval  Hip flexion    Hip extension    Hip abduction    Hip adduction    Hip internal rotation    Hip external rotation    Knee flexion    Knee extension Tight HS Tight HS  Ankle dorsiflexion    Ankle plantarflexion    Ankle inversion    Ankle eversion     (Blank rows = not tested)  LOWER EXTREMITY MMT:    MMT Right Eval Left Eval  Hip flexion 5 4  Hip extension    Hip abduction    Hip adduction    Hip internal rotation    Hip external rotation    Knee flexion 5 4  Knee extension 5 4  Ankle dorsiflexion 5 5  Ankle plantarflexion    Ankle inversion    Ankle eversion    (Blank rows = not tested)  BED MOBILITY:  Feels wobbly when he first stands up, otherwise mod I for bed mobility  TRANSFERS: Sit to stand: SBA  Assistive device utilized: None     Stand to sit: SBA  Assistive device utilized: None     Chair to chair: SBA  Assistive device utilized: None       RAMP:  Not tested  CURB:  Not tested  STAIRS: STAIRS:  Level of Assistance: CGA  Stair Negotiation Technique: Step to Pattern Alternating Pattern  with Bilateral Rails  Number of Stairs: 4   Height of Stairs: 6  Comments: alternating pattern ascending, step to pattern descending   GAIT: Findings:  GAIT: Gait pattern: scissoring, ataxic, and narrow BOS Distance walked: various clinic distances Assistive device utilized: None Level of assistance: CGA Comments: slowed gait speed  FUNCTIONAL TESTS:      mCTSIB: Condition 1: 30  sec Condition 2: 30 sec Condition 3: 30 sec Condition 4: 1 sec       =91/120                                                                                                                                TREATMENT DATE:    Self-Care/Home Management Vitals:   10/29/24 0939  BP: 127/76  Pulse: 77   Assessed in LUE at rest, BP WFL. Encouraged him to continue to monitor it at home as well.  TherAct To work on ankle strategy and balance recovery in // bars with focus on no UE support: Normal stance with one LE on green dynadisc, ball toss 2 x 10 reps L/R Staggered stance with back LE on green dynadisc, ball toss 2 x 10 rep L/R Static stance on rockerboard in A/P direction With vertical head turns x 10 reps up/down Posterior LOB with looking up With horizontal head turns x 10 reps L/R With ball toss x 15 reps Static stance on rockerboard in M/L direction With vertical head turns x 10 reps up/down With horizontal head turns x 10 reps L/R Tends to have LOB to the L with head turn to the L With ball toss x 15 reps Gait across blue mat in // bars with alt L/R gumdrop taps 3 x 10 ft Added in cog task naming foods 3 x 10 ft Decreased gait speed, increased instability, difficulty naming foods consistently without pausing   PATIENT EDUCATION: Education details: continue HEP, will discuss PT POC next week after assessing LTG and determine if he needs to add visits or is ready to d/c Person educated: Patient and Spouse Education method: Explanation, Demonstration, Tactile cues, and Verbal cues Education comprehension: verbalized understanding, returned demonstration, and needs further education  HOME EXERCISE PROGRAM: Access Code: [email or web address] URL: [email or web address] Date: 09/25/2024 Prepared by: Waddell Southgate  Exercises - Sit to Stand Without Arm Support  - 1 x daily - 7 x weekly - 3 sets - 10 reps - Staggered Sit-to-Stand  - 1 x daily - 7 x weekly - 3 sets - 10 reps - Squat with Chair Touch  - 1 x daily - 7 x weekly - 3 sets - 10 reps - Side Stepping with Resistance at Ankles and Counter Support  - 1 x daily - 7 x weekly - 3 sets - 10 reps - Standing  Balance with Eyes Closed on Foam  - 1 x daily - 7 x weekly - 1 sets - 3-5 reps - 30 sec hold   GOALS: Goals reviewed with patient? Yes  SHORT TERM GOALS: Target date: 10/08/2024   Pt will be independent with initial HEP for improved strength, balance, transfers and gait. Baseline: pt needs reminders from his wife to complete due to STM deficits (12/16) Goal status: IN PROGRESS  2.  Pt will improve FGA to 12/30 for decreased fall risk  Baseline: 8/30 (11/17), 17/30 (12/16) Goal  status: MET  3.  Pt will improve Condition 4 on mCTSIB to 15 sec for improved balance Baseline: 1 sec (11/17), 11.6 sec average (12/16) Goal status: IN PROGRESS   LONG TERM GOALS: Target date: 11/08/2024 (updated to match last scheduled visit within POC)   Pt will be independent with final HEP for improved strength, balance, transfers and gait. Baseline:  Goal status: INITIAL  2.  Pt will improve 5 x STS to less than or equal to 24 seconds to demonstrate improved functional strength and transfer efficiency.  Baseline: 27 sec no UE (11/17) Goal status: INITIAL  3.  Pt will improve gait velocity to at least 2.25 ft/sec for improved gait efficiency and performance at mod I level  Baseline: 1.97 ft/sec SBA (11/17) Goal status: INITIAL  4.  Pt will improve normal TUG to less than or equal to 15 seconds for improved functional mobility and decreased fall risk. Baseline: 18.69 sec no AD (111/7) Goal status: INITIAL  5.  Pt will improve FGA to 21/30 for decreased fall risk  Baseline: 8/30 (11/17), 17/30 (12/16) Goal status: INITIAL  6.  Pt will improve Condition 4 on mCTSIB to 30 sec for improved balance Baseline: 1 sec (11/17), 11.6 sec average (12/16) Goal status: INITIAL  ASSESSMENT:  CLINICAL IMPRESSION: Emphasis of skilled PT session on continuing to work on ankle strategy, static standing balance, and SLS stability. Pt with improved balance on rockerboard as compared to previous sessions, does  have difficulty maintaining balance in A/P direction with vertical head movements and in M/L direction with L horizontal head turns. Overall though he exhibits good use of ankle strategy for balance recovery with decreased instances of LOB and decreased reaching out with his UE to catch himself to prevent a fall. He does struggle with cog dual task with decreased speed, decreased balance, and difficulty naming foods during task. He can benefit from further practice of cog dual tasking. He continues to benefit from skilled PT services to work towards increasing his safety and independence with functional mobility. Continue POC.     OBJECTIVE IMPAIRMENTS: Abnormal gait, decreased balance, decreased coordination, difficulty walking, decreased strength, impaired perceived functional ability, and impaired vision/preception.   ACTIVITY LIMITATIONS: carrying, lifting, bending, squatting, stairs, and bed mobility  PARTICIPATION LIMITATIONS: meal prep, cleaning, laundry, medication management, interpersonal relationship, driving, and community activity  PERSONAL FACTORS: Age and 3+ comorbidities:   migraines, sleep apnea, hypertension, depressionare also affecting patient's functional outcome.   REHAB POTENTIAL: Good  CLINICAL DECISION MAKING: Evolving/moderate complexity  EVALUATION COMPLEXITY: Moderate  PLAN:  PT FREQUENCY: 2x/week  PT DURATION: 6 weeks  PLANNED INTERVENTIONS: [id number]- PT Re-evaluation, 97750- Physical Performance Testing, 97110-Therapeutic exercises, 97530- Therapeutic activity, [id number]- Neuromuscular re-education, 97535- Self Care, [id number]- Manual therapy, [id number]- Gait training, [phone]- Orthotic Initial, [phone]- Orthotic/Prosthetic subsequent, [phone]- Electrical stimulation (manual), [phone] (1-2 muscles), [phone] (3+ muscles)- Dry Needling, Patient/Family education, Balance training, Stair training, Taping, Joint mobilization, Spinal mobilization, Visual/preceptual remediation/compensation,  Cognitive remediation, DME instructions, Cryotherapy, and Moist heat  PLAN FOR NEXT SESSION: Progress static and dynamic balance as able,  Add to HEP PRN: based on FGA (head turns, obstacles, tandem, EC, backwards) pt scissors, narrow BOS, hip abd strengthening?; rockerboard, sit to stand on wedge, posterior LOB, ankle strategy, balance with EC, narrow BOS, compliant surface, SLS on RLE for improved balance/stability, resisted dot taps   Waddell Southgate, PT Waddell Southgate, PT, DPT, CSRS  10/29/2024, 10:17 AM        "

## 2024-10-29 NOTE — Therapy (Signed)
 " OUTPATIENT SPEECH LANGUAGE PATHOLOGY APHASIA TREATMENT   Patient Name: [name] Brown MRN: [id number] DOB:03/07/1951, 73 y.o., male Today's Date: 10/29/2024  PCP: Arloa Elsie SAUNDERS, MD REFERRING PROVIDER: Cristopher Bottcher, NP  END OF SESSION:  End of Session - 10/29/24 1101     Visit Number 2    Number of Visits 17    Date for Recertification  12/11/24    SLP Start Time 1018    SLP Stop Time  1101    SLP Time Calculation (min) 43 min    Activity Tolerance Patient tolerated treatment well           Past Medical History:  Diagnosis Date   Allergic rhinitis    Asthma    Depressed    Headache    migraines   Hepatitis    PMH: HEP A   Hypertension    Kidney stones    Obesity (BMI 30-39.9)    OSA on CPAP    Pneumonia    Restless legs syndrome (RLS)    Past Surgical History:  Procedure Laterality Date   COLONOSCOPY W/ BIOPSIES AND POLYPECTOMY     DISTAL BICEPS TENDON REPAIR Right 08/22/2014   Procedure: RIGHT DISTAL BICEPS TENDON DIRECT PRIMARY REPAIR;  Surgeon: Lonni CINDERELLA Poli, MD;  Location: [facility] OR;  Service: Orthopedics;  Laterality: Right;   Patient Active Problem List   Diagnosis Date Noted   Traumatic rupture of right distal biceps tendon 08/22/2014   HTN (hypertension) 09/05/2013   Obesity (BMI 30-39.9)    Restless legs syndrome (RLS)    Obstructive sleep apnea 08/25/2011   Seasonal and perennial allergic rhinitis 08/21/2007   Mild intermittent asthma in adult without complication 08/21/2007    ONSET DATE: 09/11/2024 (referral date)   REFERRING DIAG:  R41.3 (ICD-10-CM) - Other amnesia  R47.01 (ICD-10-CM) - Aphasia  R26.89 (ICD-10-CM) - Other abnormalities of gait and mobility  I63.81 (ICD-10-CM) - 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 Pt accompanied by: self and significant other  PERTINENT HISTORY: [name] Brown is a 73 year old male with a history of migraines, sleep apnea, hypertension, depression who presents with vision changes and balance issues, had a recent MRI showing recent strokes. Pt note that he will have thoughts and sometimes getting those thoughts out does not work. The flow of commu. 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 Pt notes that he golfs frequently with friends, which is major communication opportunity for pt. Pt reads currently; however, he reads less now compared to prior to strokes. Pt's wife notes that pt's memory has changed since strokes. She states that her and pt have started implementing memory strategies (making a designated locations for specific medications/certain items). Pt's wife note that pt's swallowing with pills is a bit more labored. However, swallowing is not a major concern for pt at this time. (Clinical swallow evaluation may be completed in upcoming session to check overall status).   PAIN:  Are you having pain? No  FALLS: Has patient fallen in last 6 months?  Yes Pt fell twice in the past month.   LIVING ENVIRONMENT: Lives with: lives with their family Lives in: House/apartment  PLOF:  Level of assistance: Independent with ADLs Employment: Retired Since 2020.   PATIENT GOALS: To improve communication,  reading, and short-term memory skills.   OBJECTIVE:  Note: Objective measures were completed at Evaluation unless otherwise noted.  DIAGNOSTIC FINDINGS: FINDINGS:   CT HEAD: BRAIN: Small left insula infarct on MRI last month is occult by CT. More chronic appearing bilateral basal ganglia heterogeneity.   New hypodensity in the medial left deep gray nuclei outer and near the genu of the internal capsule (series 3 image 16). Similar confluent hypodensity at the anterior right corona radiata is also new on series 3 image  19.   No associated acute intracranial hemorrhage or mass effect. No mass Lesion. No midline shift or extra-axial collection. No evidence of acute cortically based infarct.   VENTRICLES: No hydrocephalus.   ORBITS: The orbits are unremarkable.   SINUSES AND MASTOIDS: The paranasal sinuses and mastoid air cells are clear.   CTA HEAD: INTERNAL CAROTID ARTERIES: Distal cervical ICAs are patent at the skull base. Both ICA siphons are patent. Left ICA siphon is mildly to moderately tortuous with no significant plaque or stenosis. Normal left posterior communicating artery origin. Similar mild to moderate tortuosity of the right ICA siphon with no plaque or stenosis. Normal right posterior communicating artery origin. The intracranial ICAs are patent with no significant stenosis. No occlusion. No aneurysm.   ANTERIOR CEREBRAL ARTERIES: Normal ACA origins. Diminutive or absent anterior communicating artery. Bilateral ACA branches are within normal limits. No significant stenosis. No occlusion. No aneurysm.   MIDDLE CEREBRAL ARTERIES: Normal MCA origins. Bilateral MCA M1 segments appear patent and normal. Left MCA bifurcation and right MCA trifurcation appear patent and normal. Bilateral MCA branches are within normal limits. No significant stenosis. No occlusion. No aneurysm.   POSTERIOR CEREBRAL ARTERIES: Fetal type bilateral PCA origins, normal variant. Bilateral PCA branches appear normal. No significant stenosis. No occlusion. No aneurysm.   BASILAR ARTERY: Patent basilar artery is diminutive, but without stenosis. Basilar tip and SCA origins are within normal limits. No occlusion. No aneurysm.   VERTEBRAL ARTERIES: Visible distal vertebral arteries and vertebrobasilar junction are patent. Distal right vertebral artery appears mildly dominant and tortuous. Patent PICA and/or AICA are visible. No significant stenosis. No occlusion. No aneurysm.   OTHER: Major dural venous  sinuses are enhancing and appear to be patent.   SOFT TISSUES: No acute finding. No masses or lymphadenopathy.   BONES: No acute osseous abnormality.   IMPRESSION: 1. Negative intracranial CTA; some generalized arterial tortuosity with No intracranial atherosclerosis or stenosis. 2. But positive for Progressive small vessel ischemia since the MRI last month; New lacunar type infarcts in both the left deep gray nuclei and the right corona radiata. No associated acute intracranial hemorrhage or mass effect.  COGNITION: Overall cognitive status: Impaired Areas of impairment:  Memory: Impaired: Working Short term Functional deficits: Pt is losing important items.  AUDITORY COMPREHENSION: Overall auditory comprehension: Appears intact YES/NO questions: Appears intact Following directions: Appears intact Conversation: Complex Interfering components: processing speed and working memory Effective technique: extra processing time  READING COMPREHENSION: Impaired: paragraph  EXPRESSION: verbal  VERBAL EXPRESSION: Level of generative/spontaneous verbalization: conversation Automatic speech: name: intact, social response: intact, and counting: intact  Repetition: Impaired: sentence Naming: Confrontation: 76-100% Pragmatics: Appears intact Comments: Pt notes that his verbal expression is slower than prior to strokes. 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  Interfering components: N/A Effective technique: To be explored in upcoming sessions. Non-verbal means of communication: N/A  WRITTEN EXPRESSION: Dominant hand: right Written expression: Appears intact  MOTOR SPEECH: Overall  motor speech: Appears intact   ORAL MOTOR EXAMINATION: Overall status: Did not assess Comments: N/A  STANDARDIZED ASSESSMENTS: CLQT: Clock Drawing: WNL and QAB: Mild  PATIENT REPORTED OUTCOME MEASURES (PROM): Communication Participation Item Bank: 10/30 The Communicative  Participation Item Bank        Does your condition interfere with... Pt Rating   ...talking with people you know 1   ...communicating when you need to say something quickly 1   ...talking with people you do not know 1   ...communicating when you are out in your community 1   ...asking questions in a conversation 1   ....communicating in a small group of people 1   ...having a long conversation 1   ...giving detailed infomrmation 1   ...getting your turn in a fast moving conversation 1   ...trying to persuade a friend or family member to see a different point of view 1  3= Not at all; 2=A little; 1=Quite a bit; 0=Very much                                                                                                                             TREATMENT DATE:  10/29/24: Aphasia tx: SLP introduced Semantic Feature Analysis for optimizing pt's word-finding during conversation. SLP guided pt through how to perform SFA and allow pt independent opportunities for saying and writing descriptions during SFA. Pt successfully completed 4/4 trials of SFA using 6+ descriptions given occasional min A. SLP provided pt HEP focused on SFA for continued practice for minimizing communication breakdown due to anomic pauses. Plan is to continue SFA and introduce further compensations for word-finding difficulties for optimizing pt's communicative effectiveness.   Cognitive evaluation: Pt completed further portions of the CLQT (symbol trails, clock drawing, generative naming, story retelling). Plan is to finish CLQT in upcoming session due to time constraints.   10/16/24: Evaluation in progress. Pt completed QAB and aphasia PROM. CLQT was initiated and will be completed in upcoming session, along with cognition PROM. No tx completed on this date.     PATIENT EDUCATION: Education details: Definition of aphasia and cognitive-communication disorders.  Person educated: Patient and Spouse Education method:  Explanation and Handouts Education comprehension: verbalized understanding and needs further education   GOALS: Goals reviewed with patient? Yes  SHORT TERM GOALS: Target date: 11/12/24  Pt will complete CLQT+. Baseline: Goal status: ONGOING  2.  Pt will complete a PROM for cognition.  Baseline:  Goal status: Deferred.  3.  Pt will utilize circumlocution and other communication strategies for optimizing pt communication during a 12 minute conversation given occasional min A. Baseline:  Goal status: ONGOING  4.  Pt will read and comprehend a 4 paragraph text in <12 minutes using reading strategies (such as PQRST or other relevant alexia techniques) for improving pt QOL given occasional min A. Baseline:  Goal status: ONGOING  5.  Pt will demonstrate use of at least 1 external memory strategy (memory journal,  wall calendar, etc.) across 2-3 sessions given occasional min A.  Baseline:  Goal status: ONGOING  6.  Pt will complete clinical swallow evaluation (if indicated). Baseline:  Goal status: ONGOING  LONG TERM GOALS: Target date: 12/11/24  Pt will participate in complex conversation with <3 anomic pauses, using communication support strategies given rare min A. Baseline:  Goal status: ONGOING  2.  Pt will utilize at least 2 memory strategies for optimizing short-term memory recall given rare min A. Baseline:  Goal status: ONGOING  3.  Pt will comprehend moderately-complex texts pertinent to pt in 90% of opportunities given rare min A. Baseline:  Goal status: ONGOING  4.  Pt will improve score on CPIB. Baseline: 10/30 Goal status: ONGOING  5.  TBD Baseline:  Goal status: ONGOING  6.  TBD Baseline:  Goal status: ONGOING  ASSESSMENT:  CLINICAL IMPRESSION: Patient is a 73 y.o. male who was seen today for aphasia and cognitive-communication evaluation s/p multiple strokes. Pt presents with mild aphasia characterized by increased anomia and reduced language  content according pt and pt's spouse. 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 Pt notes that communication is more labored and that he does not use as many words as he use to during conversations. Pt's reading skills have decreased compared to prior to the strokes according to pt. Pt states that he could use to read multiple paragraphs without thinking about it; however, pt now notices that he requires multiple re-readings to comprehend texts. Pt reads for fun. In addition to aphasia, pt presents with cognitive-communication deficits characterized by short-term memory impairment. Pt's spouse notes that pt's short-term memory recall is reduced compared ot prior to strokes. Pt also endorses this fact. CLQT is currently in progress and will provide more information about pt's cognitive-communication skills.   ST services are recommended due to significant impacts of current deficits on pt's communication at home and in public. According to PROM, pt rated every prompt with a score of 1, which notes quite a bit of effort with using his language in multiple communication contexts. Additionally, pt needs ST services to optimize safety at home due to pt-reported short-term memory deficits. Without skilled services, pt is at risk of reduced safety and social engagement in multiple communication contexts, which all significantly impact QOL.  OBJECTIVE IMPAIRMENTS: include memory, expressive language, and aphasia. These impairments are limiting patient from ADLs/IADLs and effectively communicating at home and in community. Factors affecting potential to achieve goals and functional outcome are N/A. Patient will benefit from skilled SLP services to address above impairments and improve overall function.  REHAB POTENTIAL: Excellent  PLAN:  SLP FREQUENCY: 2x/week  SLP DURATION: 8 weeks  PLANNED INTERVENTIONS: Language facilitation, Environmental controls,  Cognitive reorganization, Internal/external aids, Functional tasks, SLP instruction and feedback, Compensatory strategies, Patient/family education, and [id number] Treatment of speech (30 or 45 min)     W[employer]ell [employer], CF-SLP 10/29/2024, 11:12 AM      "

## 2024-10-31 ENCOUNTER — Ambulatory Visit

## 2024-10-31 DIAGNOSIS — R4701 Aphasia: Secondary | ICD-10-CM

## 2024-10-31 DIAGNOSIS — R2689 Other abnormalities of gait and mobility: Secondary | ICD-10-CM | POA: Diagnosis not present

## 2024-10-31 DIAGNOSIS — R41841 Cognitive communication deficit: Secondary | ICD-10-CM

## 2024-10-31 DIAGNOSIS — M6281 Muscle weakness (generalized): Secondary | ICD-10-CM

## 2024-10-31 NOTE — Therapy (Signed)
 " OUTPATIENT SPEECH LANGUAGE PATHOLOGY APHASIA TREATMENT   Patient Name: [name] Brown MRN: [id number] DOB:[birth year], 73 y.o., male Today's Date: 10/31/2024  PCP: Arloa Elsie SAUNDERS, MD REFERRING PROVIDER: Cristopher Bottcher, NP  END OF SESSION:  End of Session - 10/31/24 0928     Visit Number 3    Number of Visits 17    Date for Recertification  12/11/24    SLP Start Time 0850    SLP Stop Time  0930    SLP Time Calculation (min) 40 min    Activity Tolerance Patient tolerated treatment well            Past Medical History:  Diagnosis Date   Allergic rhinitis    Asthma    Depressed    Headache    migraines   Hepatitis    PMH: HEP A   Hypertension    Kidney stones    Obesity (BMI 30-39.9)    OSA on CPAP    Pneumonia    Restless legs syndrome (RLS)    Past Surgical History:  Procedure Laterality Date   COLONOSCOPY W/ BIOPSIES AND POLYPECTOMY     DISTAL BICEPS TENDON REPAIR Right 08/22/2014   Procedure: RIGHT DISTAL BICEPS TENDON DIRECT PRIMARY REPAIR;  Surgeon: Lonni CINDERELLA Poli, MD;  Location: [facility] OR;  Service: Orthopedics;  Laterality: Right;   Patient Active Problem List   Diagnosis Date Noted   Traumatic rupture of right distal biceps tendon 08/22/2014   HTN (hypertension) 09/05/2013   Obesity (BMI 30-39.9)    Restless legs syndrome (RLS)    Obstructive sleep apnea 08/25/2011   Seasonal and perennial allergic rhinitis 08/21/2007   Mild intermittent asthma in adult without complication 08/21/2007    ONSET DATE: 09/11/2024 (referral date)   REFERRING DIAG:  R41.3 (ICD-10-CM) - Other amnesia  R47.01 (ICD-10-CM) - Aphasia  R26.89 (ICD-10-CM) - Other abnormalities of gait and mobility  I63.81 (ICD-10-CM) - 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 Pt accompanied by: self and significant other  PERTINENT HISTORY: [name] Brown is a 73 year old male with a history of migraines, sleep apnea, hypertension, depression who presents with vision changes and balance issues, had a recent MRI showing recent strokes. Pt note that he will have thoughts and sometimes getting those thoughts out does not work. The flow of commu. 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 Pt notes that he golfs frequently with friends, which is major communication opportunity for pt. Pt reads currently; however, he reads less now compared to prior to strokes. Pt's wife notes that pt's memory has changed since strokes. She states that her and pt have started implementing memory strategies (making a designated locations for specific medications/certain items). Pt's wife note that pt's swallowing with pills is a bit more labored. However, swallowing is not a major concern for pt at this time. (Clinical swallow evaluation may be completed in upcoming session to check overall status).   PAIN:  Are you having pain? No  FALLS: Has patient fallen in last 6 months?  Yes Pt fell twice in the past month.   LIVING ENVIRONMENT: Lives with: lives with their family Lives in: House/apartment  PLOF:  Level of assistance: Independent with ADLs Employment: Retired Since 2020.   PATIENT GOALS: To improve  communication, reading, and short-term memory skills.   OBJECTIVE:  Note: Objective measures were completed at Evaluation unless otherwise noted.  DIAGNOSTIC FINDINGS: FINDINGS:   CT HEAD: BRAIN: Small left insula infarct on MRI last month is occult by CT. More chronic appearing bilateral basal ganglia heterogeneity.   New hypodensity in the medial left deep gray nuclei outer and near the genu of the internal capsule (series 3 image 16). Similar confluent hypodensity at the anterior right corona radiata is also new on  series 3 image 19.   No associated acute intracranial hemorrhage or mass effect. No mass Lesion. No midline shift or extra-axial collection. No evidence of acute cortically based infarct.   VENTRICLES: No hydrocephalus.   ORBITS: The orbits are unremarkable.   SINUSES AND MASTOIDS: The paranasal sinuses and mastoid air cells are clear.   CTA HEAD: INTERNAL CAROTID ARTERIES: Distal cervical ICAs are patent at the skull base. Both ICA siphons are patent. Left ICA siphon is mildly to moderately tortuous with no significant plaque or stenosis. Normal left posterior communicating artery origin. Similar mild to moderate tortuosity of the right ICA siphon with no plaque or stenosis. Normal right posterior communicating artery origin. The intracranial ICAs are patent with no significant stenosis. No occlusion. No aneurysm.   ANTERIOR CEREBRAL ARTERIES: Normal ACA origins. Diminutive or absent anterior communicating artery. Bilateral ACA branches are within normal limits. No significant stenosis. No occlusion. No aneurysm.   MIDDLE CEREBRAL ARTERIES: Normal MCA origins. Bilateral MCA M1 segments appear patent and normal. Left MCA bifurcation and right MCA trifurcation appear patent and normal. Bilateral MCA branches are within normal limits. No significant stenosis. No occlusion. No aneurysm.   POSTERIOR CEREBRAL ARTERIES: Fetal type bilateral PCA origins, normal variant. Bilateral PCA branches appear normal. No significant stenosis. No occlusion. No aneurysm.   BASILAR ARTERY: Patent basilar artery is diminutive, but without stenosis. Basilar tip and SCA origins are within normal limits. No occlusion. No aneurysm.   VERTEBRAL ARTERIES: Visible distal vertebral arteries and vertebrobasilar junction are patent. Distal right vertebral artery appears mildly dominant and tortuous. Patent PICA and/or AICA are visible. No significant stenosis. No occlusion. No aneurysm.    OTHER: Major dural venous sinuses are enhancing and appear to be patent.   SOFT TISSUES: No acute finding. No masses or lymphadenopathy.   BONES: No acute osseous abnormality.   IMPRESSION: 1. Negative intracranial CTA; some generalized arterial tortuosity with No intracranial atherosclerosis or stenosis. 2. But positive for Progressive small vessel ischemia since the MRI last month; New lacunar type infarcts in both the left deep gray nuclei and the right corona radiata. No associated acute intracranial hemorrhage or mass effect.  COGNITION: Overall cognitive status: Impaired Areas of impairment:  Memory: Impaired: Working Short term Functional deficits: Pt is losing important items.  AUDITORY COMPREHENSION: Overall auditory comprehension: Appears intact YES/NO questions: Appears intact Following directions: Appears intact Conversation: Complex Interfering components: processing speed and working memory Effective technique: extra processing time  READING COMPREHENSION: Impaired: paragraph  EXPRESSION: verbal  VERBAL EXPRESSION: Level of generative/spontaneous verbalization: conversation Automatic speech: name: intact, social response: intact, and counting: intact  Repetition: Impaired: sentence Naming: Confrontation: 76-100% Pragmatics: Appears intact Comments: Pt notes that his verbal expression is slower than prior to strokes. 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  Interfering components: N/A Effective technique: To be explored in upcoming sessions. Non-verbal means of communication: N/A  WRITTEN EXPRESSION: Dominant hand: right Written expression: Appears intact  MOTOR SPEECH:  Overall motor speech: Appears intact   ORAL MOTOR EXAMINATION: Overall status: Did not assess Comments: N/A  STANDARDIZED ASSESSMENTS: CLQT: Attention: Mild, Memory: WNL, Executive Function: Mild, Language: Mild, Visuospatial Skills: Mild, and Clock  Drawing: Mild and QAB: Mild  PATIENT REPORTED OUTCOME MEASURES (PROM): Communication Participation Item Bank: 10/30 The Communicative Participation Item Bank        Does your condition interfere with... Pt Rating   ...talking with people you know 1   ...communicating when you need to say something quickly 1   ...talking with people you do not know 1   ...communicating when you are out in your community 1   ...asking questions in a conversation 1   ....communicating in a small group of people 1   ...having a long conversation 1   ...giving detailed infomrmation 1   ...getting your turn in a fast moving conversation 1   ...trying to persuade a friend or family member to see a different point of view 1  3= Not at all; 2=A little; 1=Quite a bit; 0=Very much                                                                                                                             TREATMENT DATE:  10/31/24: Aphasia tx: SLP targeted naming and anomia compensation of description through [employer]. SLP provided education on use of SFA as rehabilitation tool to improve lexical representation with improved word finding, use as structure to aid in description in conversation, and as self-cueing strategy. Given visual target, pt able to name 3/3 on initial attempt. Pt able to generate average of 6/6 salient features given rare min-A. Pt benefited from having to expand answers into longer, sentence length utterances for additional practice. Plan is to introduce Occupational Psychologist ([facility]).    Cognitive evaluation: Pt finished CLQT. SLP educated pt about final results and initiated education about utilizing visual cues for optimizing short-term memory and sustained attention to ADLs. Pt's wife notes that she noticed pt have aninattentive episode in the previous night; pt started a new topic of conversation, only to drop it after pt's wife left the room briefly. Plan is  to continue instruction of compensatory cognitive strategies for maximizing cognitive-communication skills in ADLs for improving pt QOL.  10/29/24: Aphasia tx: SLP introduced Semantic Feature Analysis for optimizing pt's word-finding during conversation. SLP guided pt through how to perform SFA and allow pt independent opportunities for saying and writing descriptions during SFA. Pt successfully completed 4/4 trials of SFA using 6+ descriptions given occasional min A. SLP provided pt HEP focused on SFA for continued practice for minimizing communication breakdown due to anomic pauses. Plan is to continue SFA and introduce further compensations for word-finding difficulties for optimizing pt's communicative effectiveness.   Cognitive evaluation: Pt completed further portions of the CLQT (symbol trails, clock drawing, generative naming, story retelling). Plan is to finish CLQT in upcoming session due to time constraints.  10/16/24: Evaluation in progress. Pt completed QAB and aphasia PROM. CLQT was initiated and will be completed in upcoming session, along with cognition PROM. No tx completed on this date.     PATIENT EDUCATION: Education details: Definition of aphasia and cognitive-communication disorders.  Person educated: Patient and Spouse Education method: Explanation and Handouts Education comprehension: verbalized understanding and needs further education   GOALS: Goals reviewed with patient? Yes  SHORT TERM GOALS: Target date: 11/12/24  Pt will complete CLQT+. Baseline: Goal status: MET  2.  Pt will complete a PROM for cognition.  Baseline:  Goal status: Deferred.  3.  Pt will utilize circumlocution and other communication strategies for optimizing pt communication during a 12 minute conversation given occasional min A. Baseline:  Goal status: ONGOING  4.  Pt will read and comprehend a 4 paragraph text in <12 minutes using reading strategies (such as PQRST or other relevant  alexia techniques) for improving pt QOL given occasional min A. Baseline:  Goal status: ONGOING  5.  Pt will demonstrate use of at least 1 external memory strategy (memory journal, wall calendar, etc.) across 2-3 sessions given occasional min A.  Baseline:  Goal status: ONGOING  6.  Pt will complete clinical swallow evaluation (if indicated). Baseline:  Goal status: ONGOING  LONG TERM GOALS: Target date: 12/11/24  Pt will participate in complex conversation with <3 anomic pauses, using communication support strategies given rare min A. Baseline:  Goal status: ONGOING  2.  Pt will utilize at least 2 memory strategies for optimizing short-term memory recall given rare min A. Baseline:  Goal status: ONGOING  3.  Pt will comprehend moderately-complex texts pertinent to pt in 90% of opportunities given rare min A. Baseline:  Goal status: ONGOING  4.  Pt will improve score on CPIB. Baseline: 10/30 Goal status: ONGOING  5.  TBD Baseline:  Goal status: ONGOING  6.  TBD Baseline:  Goal status: ONGOING  ASSESSMENT:  CLINICAL IMPRESSION: Patient is a 73 y.o. male who was seen today for aphasia and cognitive-communication evaluation s/p multiple strokes. Pt presents with mild aphasia characterized by increased anomia and reduced language content according pt and pt's spouse. 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 Pt notes that communication is more labored and that he does not use as many words as he use to during conversations. Pt's reading skills have decreased compared to prior to the strokes according to pt. Pt states that he could use to read multiple paragraphs without thinking about it; however, pt now notices that he requires multiple re-readings to comprehend texts. Pt reads for fun. In addition to aphasia, pt presents with cognitive-communication deficits characterized by short-term memory impairment. Pt's spouse notes that  pt's short-term memory recall is reduced compared ot prior to strokes. Pt also endorses this fact. CLQT is currently in progress and will provide more information about pt's cognitive-communication skills.   ST services are recommended due to significant impacts of current deficits on pt's communication at home and in public. According to PROM, pt rated every prompt with a score of 1, which notes quite a bit of effort with using his language in multiple communication contexts. Additionally, pt needs ST services to optimize safety at home due to pt-reported short-term memory deficits. Without skilled services, pt is at risk of reduced safety and social engagement in multiple communication contexts, which all significantly impact QOL.  OBJECTIVE IMPAIRMENTS: include memory, expressive language, and aphasia.  These impairments are limiting patient from ADLs/IADLs and effectively communicating at home and in community. Factors affecting potential to achieve goals and functional outcome are N/A. Patient will benefit from skilled SLP services to address above impairments and improve overall function.  REHAB POTENTIAL: Excellent  PLAN:  SLP FREQUENCY: 2x/week  SLP DURATION: 8 weeks  PLANNED INTERVENTIONS: Language facilitation, Environmental controls, Cognitive reorganization, Internal/external aids, Functional tasks, SLP instruction and feedback, Compensatory strategies, Patient/family education, and [id number] Treatment of speech (30 or 45 min)     [employer], CF-SLP 10/31/2024, 2:15 PM      "

## 2024-10-31 NOTE — Therapy (Signed)
 " OUTPATIENT PHYSICAL THERAPY NEURO TREATMENT   Patient Name: [name] Brown MRN: [id number] DOB:[birth year], 73 y.o., male Today's Date: 10/31/2024   PCP: [name] Bottcher, NP REFERRING PROVIDER: Cristopher Bottcher, NP  END OF SESSION:  PT End of Session - 10/31/24 0933     Visit Number 8    Number of Visits 13   with eval   Date for Recertification  11/12/24   to allow for scheduling delays   Authorization Type Medicare    PT Start Time 0930    PT Stop Time 1015    PT Time Calculation (min) 45 min    Equipment Utilized During Treatment Gait belt    Activity Tolerance Patient tolerated treatment well    Behavior During Therapy WFL for tasks assessed/performed               Past Medical History:  Diagnosis Date   Allergic rhinitis    Asthma    Depressed    Headache    migraines   Hepatitis    PMH: HEP A   Hypertension    Kidney stones    Obesity (BMI 30-39.9)    OSA on CPAP    Pneumonia    Restless legs syndrome (RLS)    Past Surgical History:  Procedure Laterality Date   COLONOSCOPY W/ BIOPSIES AND POLYPECTOMY     DISTAL BICEPS TENDON REPAIR Right 08/22/2014   Procedure: RIGHT DISTAL BICEPS TENDON DIRECT PRIMARY REPAIR;  Surgeon: Lonni CINDERELLA Poli, MD;  Location: [facility] OR;  Service: Orthopedics;  Laterality: Right;   Patient Active Problem List   Diagnosis Date Noted   Traumatic rupture of right distal biceps tendon 08/22/2014   HTN (hypertension) 09/05/2013   Obesity (BMI 30-39.9)    Restless legs syndrome (RLS)    Obstructive sleep apnea 08/25/2011   Seasonal and perennial allergic rhinitis 08/21/2007   Mild intermittent asthma in adult without complication 08/21/2007    ONSET DATE: 09/11/2024 (referral date)  REFERRING DIAG: R26.89 (ICD-10-CM) - Other abnormalities of gait and mobility I63.81 (ICD-10-CM) - 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 I am able to catch my self litlte bit better where I would fall off before. I  am able to take step to recover better.  Pt accompanied by: significant other wife [name] Brown  PERTINENT HISTORY: [name] Brown is a 73 year old male with a history of migraines, sleep apnea, hypertension, depression who presents with vision changes and balance issues, had a recent MRI showing recent strokes.   PAIN:  Are you having pain? No  PRECAUTIONS: Fall  RED FLAGS: None   WEIGHT BEARING RESTRICTIONS: No  FALLS: Has patient fallen in last 6 months? Yes. Number of falls 1, fell in the bedroom - no injuries, was able to get back up himself  LIVING ENVIRONMENT: Lives with: lives with their spouse Lives in: House/apartment Stairs: Yes: Internal: 12 steps; on right going up; wife provides Supervision on stairs Has following equipment at home: CPAP machine  PLOF: Independent  PATIENT GOALS: to get my balance better  OBJECTIVE:  Note: Objective measures were completed at Evaluation unless otherwise noted.  DIAGNOSTIC FINDINGS:  MRI Brain 08/24/2024 1. Acute infarct in the left subinsular region.  2. Possible small subacute infarct in the right basal ganglia.  3. Chronic microvascular ischemic changes.  4. Mild generalized parenchymal volume loss.     Carotid US  08/24/2024 Right Carotid: There is no evidence of stenosis in the right ICA.  Left Carotid: There is no evidence of stenosis in the left ICA.  Vertebrals:  Bilateral vertebral arteries demonstrate antegrade flow.  Subclavians: Normal flow hemodynamics were seen in bilateral subclavian arteries.    COGNITION: Overall cognitive status: Within functional limits for tasks assessed   SENSATION: No N/T Light touch WFL  COORDINATION: Impaired in BUE and BLE slightly  EDEMA:  R ankle swelling  POSTURE: rounded shoulders and forward head  LOWER EXTREMITY ROM:     Active  Right Eval Left Eval  Hip flexion    Hip extension    Hip abduction    Hip adduction    Hip internal rotation    Hip external rotation    Knee flexion    Knee extension Tight HS Tight HS  Ankle dorsiflexion    Ankle plantarflexion    Ankle inversion    Ankle eversion     (Blank rows = not tested)  LOWER EXTREMITY MMT:    MMT Right Eval Left Eval  Hip flexion 5 4  Hip extension    Hip abduction    Hip adduction    Hip internal rotation    Hip external rotation    Knee flexion 5 4  Knee extension 5 4  Ankle dorsiflexion 5 5  Ankle plantarflexion    Ankle inversion    Ankle eversion    (Blank rows = not tested)  BED MOBILITY:  Feels wobbly when he first stands up, otherwise mod I for bed mobility  TRANSFERS: Sit to stand: SBA  Assistive device utilized: None     Stand to sit: SBA  Assistive device utilized: None     Chair to chair: SBA  Assistive device utilized: None       RAMP:  Not tested  CURB:  Not tested  STAIRS: STAIRS:  Level of Assistance: CGA  Stair Negotiation Technique: Step to Pattern Alternating Pattern  with Bilateral Rails  Number of Stairs: 4   Height of Stairs: 6  Comments: alternating pattern ascending, step to pattern descending   GAIT: Findings:  GAIT: Gait pattern: scissoring, ataxic, and narrow BOS Distance walked: various clinic distances Assistive device utilized: None Level of assistance: CGA Comments: slowed gait speed  FUNCTIONAL TESTS:  mCTSIB: Condition 1: 30 sec Condition 2: 30 sec Condition 3: 30 sec Condition 4: 1 sec       =91/120                                                                                                                                TREATMENT DATE:    Self-Care/Home Management There were no vitals filed for this visit.  Modified CTSIB: Condition 4: 5 sec, 3 sec, 3 sec (3 trials) Pt educated that he has posterior LOB, pt educated on leaning on the balls of his foot and maintaining slight anterior lean when he stands with EC, tactile cues provided with PT's hand on anterior chest wall for patient to lean against. Performed 4-5 practice trials Performed modified CTSIB: condition 4: 30 sec, 10 sec, 10 sec Gait training: 1 x 115' cues for arm swings, pt tends to cross his legs when turning Performed modified CTSIB: pt given cues to relax mentally and not put to much pressure on performing. Pt educated to work on being better than before. Reminded pt of previous cues: pt performed 30 sec, 30 sec again.  Gait training: 1 x 230', mod cues for bigger arm swings Blazepod: pt to stay facing one direction, and walk fwd, bwd, sideways or diagonal fwd/bwd to get to pods. Trial: 1, in 3 min, 18 hits, [phone] avg reaction time Trial 2, in 3 min, 25 hits, 3315 avg reaction time, - pt had about 10 instances where the same pod where he was standing kept light Trial 3: in 3 min, 22 hits, 4594 avg reaction time   PATIENT EDUCATION: Education details: continue HEP, will discuss PT POC next week after assessing LTG and determine if he needs to add visits or is ready to d/c Person educated: Patient and Spouse Education method: Explanation, Demonstration, Tactile cues, and Verbal cues Education comprehension: verbalized understanding, returned demonstration, and needs further education  HOME EXERCISE PROGRAM: Access Code: [email or web address] URL: [email or web address] Date: 09/25/2024 Prepared by: Waddell Southgate  Exercises - Sit to Stand Without Arm Support  - 1 x daily - 7 x weekly - 3 sets - 10 reps - Staggered Sit-to-Stand  - 1 x daily - 7 x weekly - 3 sets - 10 reps - Squat with Chair Touch   - 1 x daily - 7 x weekly - 3 sets - 10 reps - Side Stepping with Resistance at Ankles and Counter Support  - 1 x daily - 7 x weekly - 3 sets - 10 reps - Standing Balance with Eyes Closed on Foam  - 1 x daily - 7 x weekly - 1 sets - 3-5 reps - 30 sec hold   GOALS: Goals reviewed with patient? Yes  SHORT TERM GOALS: Target date: 10/08/2024   Pt will be independent with initial HEP for improved strength, balance, transfers and gait. Baseline: pt needs reminders from his wife to complete due to STM deficits (12/16)  Goal status: IN PROGRESS  2.  Pt will improve FGA to 12/30 for decreased fall risk  Baseline: 8/30 (11/17), 17/30 (12/16) Goal status: MET  3.  Pt will improve Condition 4 on mCTSIB to 15 sec for improved balance Baseline: 1 sec (11/17), 11.6 sec average (12/16) Goal status: IN PROGRESS   LONG TERM GOALS: Target date: 11/08/2024 (updated to match last scheduled visit within POC)   Pt will be independent with final HEP for improved strength, balance, transfers and gait. Baseline:  Goal status: INITIAL  2.  Pt will improve 5 x STS to less than or equal to 24 seconds to demonstrate improved functional strength and transfer efficiency.  Baseline: 27 sec no UE (11/17) Goal status: INITIAL  3.  Pt will improve gait velocity to at least 2.25 ft/sec for improved gait efficiency and performance at mod I level  Baseline: 1.97 ft/sec SBA (11/17) Goal status: INITIAL  4.  Pt will improve normal TUG to less than or equal to 15 seconds for improved functional mobility and decreased fall risk. Baseline: 18.69 sec no AD (111/7) Goal status: INITIAL  5.  Pt will improve FGA to 21/30 for decreased fall risk  Baseline: 8/30 (11/17), 17/30 (12/16) Goal status: INITIAL  6.  Pt will improve Condition 4 on mCTSIB to 30 sec for improved balance Baseline: 1 sec (11/17), 11.6 sec average (12/16) Goal status: INITIAL  ASSESSMENT:  CLINICAL IMPRESSION: Pt continues to demo posterior  LOB with dynamic balance activities. Patient's Modified CTSIB score improved compared to evaluation today. Pt demo improved performance after education and strategies to improve balance with standing on airex foam. Pt demo improved performance with blazepod activity from trial 1 to trial 3 (Trial 2 can be excluded as same blaze pods were lighting up mulitple times which falsified his performance).     OBJECTIVE IMPAIRMENTS: Abnormal gait, decreased balance, decreased coordination, difficulty walking, decreased strength, impaired perceived functional ability, and impaired vision/preception.   ACTIVITY LIMITATIONS: carrying, lifting, bending, squatting, stairs, and bed mobility  PARTICIPATION LIMITATIONS: meal prep, cleaning, laundry, medication management, interpersonal relationship, driving, and community activity  PERSONAL FACTORS: Age and 3+ comorbidities:   migraines, sleep apnea, hypertension, depressionare also affecting patient's functional outcome.   REHAB POTENTIAL: Good  CLINICAL DECISION MAKING: Evolving/moderate complexity  EVALUATION COMPLEXITY: Moderate  PLAN:  PT FREQUENCY: 2x/week  PT DURATION: 6 weeks  PLANNED INTERVENTIONS: 97164- PT Re-evaluation, 97750- Physical Performance Testing, 97110-Therapeutic exercises, 97530- Therapeutic activity, [id number]- Neuromuscular re-education, 97535- Self Care, [id number]- Manual therapy, [id number]- Gait training, [phone]- Orthotic Initial, [phone]- Orthotic/Prosthetic subsequent, [id number]- Electrical stimulation (manual), [phone] (1-2 muscles), 20561 (3+ muscles)- Dry Needling, Patient/Family education, Balance training, Stair training, Taping, Joint mobilization, Spinal mobilization, Visual/preceptual remediation/compensation, Cognitive remediation, DME instructions, Cryotherapy, and Moist heat  PLAN FOR NEXT SESSION: Progress static and dynamic balance as able,  Add to HEP PRN: based on FGA (head turns, obstacles, tandem, EC, backwards) pt scissors,  narrow BOS, hip abd strengthening?; rockerboard, sit to stand on wedge, posterior LOB, ankle strategy, balance with EC, narrow BOS, compliant surface, SLS on RLE for improved balance/stability, resisted dot taps   Raj LOISE Blanch, PT  10/31/2024, 9:34 AM        "

## 2024-11-05 ENCOUNTER — Ambulatory Visit: Attending: Family Medicine | Admitting: Physical Therapy

## 2024-11-05 VITALS — BP 119/69 | HR 99

## 2024-11-05 DIAGNOSIS — R4701 Aphasia: Secondary | ICD-10-CM | POA: Diagnosis present

## 2024-11-05 DIAGNOSIS — R2681 Unsteadiness on feet: Secondary | ICD-10-CM | POA: Insufficient documentation

## 2024-11-05 DIAGNOSIS — M6281 Muscle weakness (generalized): Secondary | ICD-10-CM | POA: Insufficient documentation

## 2024-11-05 DIAGNOSIS — R41841 Cognitive communication deficit: Secondary | ICD-10-CM | POA: Insufficient documentation

## 2024-11-05 DIAGNOSIS — R2689 Other abnormalities of gait and mobility: Secondary | ICD-10-CM | POA: Insufficient documentation

## 2024-11-05 DIAGNOSIS — R278 Other lack of coordination: Secondary | ICD-10-CM | POA: Insufficient documentation

## 2024-11-05 NOTE — Therapy (Signed)
 " OUTPATIENT PHYSICAL THERAPY NEURO TREATMENT   Patient Name: [name] Brown MRN: [id number] DOB:1951/03/22, 74 y.o., male Today's Date: 11/05/2024   PCP: Cristopher Bottcher, NP REFERRING PROVIDER: Cristopher Bottcher, NP  END OF SESSION:  PT End of Session - 11/05/24 1314     Visit Number 9    Number of Visits 13   with eval   Date for Recertification  11/12/24   to allow for scheduling delays   Authorization Type Medicare    PT Start Time 1313    PT Stop Time 1400    PT Time Calculation (min) 47 min    Equipment Utilized During Treatment Gait belt    Activity Tolerance Patient tolerated treatment well    Behavior During Therapy WFL for tasks assessed/performed                Past Medical History:  Diagnosis Date   Allergic rhinitis    Asthma    Depressed    Headache    migraines   Hepatitis    PMH: HEP A   Hypertension    Kidney stones    Obesity (BMI 30-39.9)    OSA on CPAP    Pneumonia    Restless legs syndrome (RLS)    Past Surgical History:  Procedure Laterality Date   COLONOSCOPY W/ BIOPSIES AND POLYPECTOMY     DISTAL BICEPS TENDON REPAIR Right 08/22/2014   Procedure: RIGHT DISTAL BICEPS TENDON DIRECT PRIMARY REPAIR;  Surgeon: Lonni CINDERELLA Poli, MD;  Location: [facility] OR;  Service: Orthopedics;  Laterality: Right;   Patient Active Problem List   Diagnosis Date Noted   Traumatic rupture of right distal biceps tendon 08/22/2014   HTN (hypertension) 09/05/2013   Obesity (BMI 30-39.9)    Restless legs syndrome (RLS)    Obstructive sleep apnea 08/25/2011   Seasonal and perennial allergic rhinitis 08/21/2007   Mild intermittent asthma in adult without complication 08/21/2007    ONSET DATE: 09/11/2024 (referral date)  REFERRING DIAG: R26.89 (ICD-10-CM) - Other abnormalities of gait and mobility I63.81 (ICD-10-CM) - 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 Pt denies any questions over his exercises. Pt did enjoy golf prior to his stroke, has not tried it again yet. He has been able to mow the yard with no issues. Pt does struggle a bit to get back up from the floor but that was a struggle before his stroke.  Pt accompanied by: significant other wife [name]  PERTINENT HISTORY: [name] Brown is a 74 year old male with a history of migraines, sleep apnea, hypertension, depression who presents with vision changes and balance issues, had a recent MRI showing recent strokes.   PAIN:  Are you having pain? No  PRECAUTIONS: Fall  RED FLAGS: None   WEIGHT BEARING RESTRICTIONS: No  FALLS: Has patient fallen in last 6 months? Yes. Number of falls 1, fell in the bedroom - no injuries, was able to get back up himself  LIVING ENVIRONMENT: Lives with: lives with their spouse Lives in: House/apartment Stairs: Yes: Internal: 12 steps; on right going up; wife provides Supervision on stairs Has following equipment at home: CPAP machine  PLOF: Independent  PATIENT GOALS: to get my balance better  OBJECTIVE:  Note: Objective measures were completed at Evaluation unless otherwise noted.  DIAGNOSTIC FINDINGS:  MRI Brain 08/24/2024 1. Acute infarct in the left subinsular region.  2. Possible small subacute infarct in the right basal ganglia.  3. Chronic microvascular ischemic changes.  4. Mild generalized parenchymal volume loss.     Carotid US   08/24/2024 Right Carotid: There is no evidence of stenosis in the right ICA.  Left Carotid: There is no evidence of stenosis in the left ICA.  Vertebrals:  Bilateral vertebral arteries demonstrate antegrade flow.  Subclavians: Normal flow hemodynamics were seen in bilateral subclavian arteries.   COGNITION: Overall cognitive status: Within functional limits for tasks assessed   SENSATION: No N/T Light touch WFL  COORDINATION: Impaired in BUE and BLE slightly  EDEMA:  R ankle swelling  POSTURE: rounded shoulders and forward head  LOWER EXTREMITY ROM:     Active  Right Eval Left Eval  Hip flexion    Hip extension    Hip abduction    Hip adduction    Hip internal rotation    Hip external rotation    Knee flexion    Knee extension Tight HS Tight HS  Ankle dorsiflexion    Ankle plantarflexion    Ankle inversion    Ankle eversion     (Blank rows = not tested)  LOWER EXTREMITY MMT:    MMT Right Eval Left Eval  Hip flexion 5 4  Hip extension    Hip abduction    Hip adduction    Hip internal rotation    Hip external rotation    Knee flexion 5 4  Knee extension 5 4  Ankle dorsiflexion 5 5  Ankle plantarflexion    Ankle inversion    Ankle eversion    (Blank rows = not tested)  BED MOBILITY:  Feels wobbly when he first stands up, otherwise mod I for bed mobility  TRANSFERS: Sit to stand: SBA  Assistive device utilized: None     Stand to sit: SBA  Assistive device utilized: None     Chair to chair: SBA  Assistive device utilized: None       RAMP:  Not tested  CURB:  Not tested  STAIRS: STAIRS:  Level of Assistance: CGA  Stair Negotiation Technique: Step to  Pattern Alternating Pattern  with Bilateral Rails  Number of Stairs: 4   Height of Stairs: 6  Comments: alternating pattern ascending, step to pattern descending   GAIT: Findings:  GAIT: Gait pattern: scissoring, ataxic, and narrow BOS Distance walked: various clinic distances Assistive  device utilized: None Level of assistance: CGA Comments: slowed gait speed  FUNCTIONAL TESTS:      mCTSIB: Condition 1: 30 sec Condition 2: 30 sec Condition 3: 30 sec Condition 4: 1 sec       =91/120                                                                                                                               TREATMENT DATE:    Self-Care/Home Management Vitals:   11/05/24 1320  BP: 119/69  Pulse: 99   BP assessed in LUE in siting at rest, vitals WNL. Stroke Edu:  Risk factors BE FAST (provided handout) What a stroke is and recovery process Discuss return to driving with Dr. Gregg in June (memory impairments biggest concern)  TherAct Discussed practicing swings at home with golf clubs and practicing on driving range before going on golf course To work on floor transfers and proximal LE strengthening: Mat to/from mat on floor transfer x 3 reps at [employer] I level. Pt demonstrates good transfer technique with use of half kneel position. Half kneel on red mat on floor: L/R 8# weighted ball chest press 2 x 10 reps B Tall kneel on red mat on floor: Mini-squats 2 x 10 reps To work on dynamic balance and SLS stability: Rebounder ball toss with 1 kg red ball Static toss x 15 reps Forward step with toss x 15 reps Forward step with toss on blue mat x 15 reps Pt does tend to IR R hip when stepping forwards with RLE Resisted red band dot taps no UE with alt LE tapping Min A for balance More difficulty reaching anteriorly with RLE vs laterally   Gait pattern: some path deviation, scissoring, and narrow BOS Distance walked: 115 ft x 2 Assistive device utilized: None Level of assistance: Modified independence Comments: following tall kneel and half-kneel tasks    PATIENT EDUCATION: Education details: continue HEP, will assess LTG and likely d/c next visit if pt is agreeable Person educated: Patient and Spouse Education method: Explanation, Demonstration,  Tactile cues, and Verbal cues Education comprehension: verbalized understanding, returned demonstration, and needs further education  HOME EXERCISE PROGRAM: Access Code: [email or web address] URL: [email or web address] Date: 09/25/2024 Prepared by: Waddell Southgate  Exercises - Sit to Stand Without Arm Support  - 1 x daily - 7 x weekly - 3 sets - 10 reps - Staggered Sit-to-Stand  - 1 x daily - 7 x weekly - 3 sets - 10 reps - Squat with Chair Touch  - 1 x daily - 7 x weekly - 3 sets - 10 reps - Side Stepping with Resistance at Ankles and Counter Support  - 1  x daily - 7 x weekly - 3 sets - 10 reps - Standing Balance with Eyes Closed on Foam  - 1 x daily - 7 x weekly - 1 sets - 3-5 reps - 30 sec hold   GOALS: Goals reviewed with patient? Yes  SHORT TERM GOALS: Target date: 10/08/2024   Pt will be independent with initial HEP for improved strength, balance, transfers and gait. Baseline: pt needs reminders from his wife to complete due to STM deficits (12/16) Goal status: IN PROGRESS  2.  Pt will improve FGA to 12/30 for decreased fall risk  Baseline: 8/30 (11/17), 17/30 (12/16) Goal status: MET  3.  Pt will improve Condition 4 on mCTSIB to 15 sec for improved balance Baseline: 1 sec (11/17), 11.6 sec average (12/16) Goal status: IN PROGRESS   LONG TERM GOALS: Target date: 11/08/2024 (updated to match last scheduled visit within POC)   Pt will be independent with final HEP for improved strength, balance, transfers and gait. Baseline:  Goal status: INITIAL  2.  Pt will improve 5 x STS to less than or equal to 24 seconds to demonstrate improved functional strength and transfer efficiency.  Baseline: 27 sec no UE (11/17) Goal status: INITIAL  3.  Pt will improve gait velocity to at least 2.25 ft/sec for improved gait efficiency and performance at mod I level  Baseline: 1.97 ft/sec SBA (11/17) Goal status: INITIAL  4.  Pt will improve normal TUG to less than or equal to 15  seconds for improved functional mobility and decreased fall risk. Baseline: 18.69 sec no AD (111/7) Goal status: INITIAL  5.  Pt will improve FGA to 21/30 for decreased fall risk  Baseline: 8/30 (11/17), 17/30 (12/16) Goal status: INITIAL  6.  Pt will improve Condition 4 on mCTSIB to 30 sec for improved balance Baseline: 1 sec (11/17), 11.6 sec average (12/16) Goal status: INITIAL  ASSESSMENT:  CLINICAL IMPRESSION: Emphasis of skilled PT session on continuing to assess vitals, reviewing stroke education including risk factors, signs of a stroke, what to do in the event of a stroke, stroke prevention, healing process from a stroke, etc., working on floor transfers, and continuing to work on sales executive and SLS stability. Pt's vitals remain WNL, encouraged him to continue to monitor his BP at home as part of stroke prevention. Provided handout for BE FAST following discussion with patient and his wife regarding stroke education. Pt able to perform floor transfers mod I and is already performing them with a good technique, encouraged him to continue to do transfers this way. He does continue to exhibit decreased balance with SLS on his RLE. Plan to assess LTG and potentially d/c next visit if pt agreeable. Continue POC.      OBJECTIVE IMPAIRMENTS: Abnormal gait, decreased balance, decreased coordination, difficulty walking, decreased strength, impaired perceived functional ability, and impaired vision/preception.   ACTIVITY LIMITATIONS: carrying, lifting, bending, squatting, stairs, and bed mobility  PARTICIPATION LIMITATIONS: meal prep, cleaning, laundry, medication management, interpersonal relationship, driving, and community activity  PERSONAL FACTORS: Age and 3+ comorbidities:   migraines, sleep apnea, hypertension, depressionare also affecting patient's functional outcome.   REHAB POTENTIAL: Good  CLINICAL DECISION MAKING: Evolving/moderate complexity  EVALUATION  COMPLEXITY: Moderate  PLAN:  PT FREQUENCY: 2x/week  PT DURATION: 6 weeks  PLANNED INTERVENTIONS: 97164- PT Re-evaluation, 97750- Physical Performance Testing, 97110-Therapeutic exercises, 97530- Therapeutic activity, [id number]- Neuromuscular re-education, 97535- Self Care, 02859- Manual therapy, [id number]- Gait training, [id number]- Orthotic Initial, [id number]- Orthotic/Prosthetic subsequent, [id number]-  Electrical stimulation (manual), 79439 (1-2 muscles), 20561 (3+ muscles)- Dry Needling, Patient/Family education, Balance training, Stair training, Taping, Joint mobilization, Spinal mobilization, Visual/preceptual remediation/compensation, Cognitive remediation, DME instructions, Cryotherapy, and Moist heat  PLAN FOR NEXT SESSION: 10th PN and assess LTG and d/c   Waddell Southgate, PT Waddell Southgate, PT, DPT, CSRS   11/05/2024, 2:03 PM        "

## 2024-11-08 ENCOUNTER — Ambulatory Visit: Admitting: Physical Therapy

## 2024-11-08 VITALS — BP 128/78 | HR 77

## 2024-11-08 DIAGNOSIS — R278 Other lack of coordination: Secondary | ICD-10-CM

## 2024-11-08 DIAGNOSIS — R2681 Unsteadiness on feet: Secondary | ICD-10-CM

## 2024-11-08 DIAGNOSIS — R2689 Other abnormalities of gait and mobility: Secondary | ICD-10-CM

## 2024-11-08 DIAGNOSIS — M6281 Muscle weakness (generalized): Secondary | ICD-10-CM

## 2024-11-08 NOTE — Therapy (Signed)
 " OUTPATIENT PHYSICAL THERAPY NEURO TREATMENT - 10th VISIT PROGRESS NOTE and DISCHARGE NOTE   Patient Name: [name] Brown MRN: [id number] DOB:May 07, 1951, 74 y.o., male Today's Date: 11/08/2024   PCP: Cristopher Bottcher, NP REFERRING PROVIDER: Cristopher Bottcher, NP  PHYSICAL THERAPY DISCHARGE SUMMARY  Visits from Start of Care: 10  Current functional level related to goals / functional outcomes: Mod I   Remaining deficits: Mild balance impairments   Education / Equipment: Handout for final HEP   Patient agrees to discharge. Patient goals were met. Patient is being discharged due to meeting the stated rehab goals.   Physical Therapy Progress Note   Dates of Reporting Period: 09/17/2024 - 11/08/2024  See Note below for Objective Data and Assessment of Progress/Goals.  Thank you for the referral of this patient. Waddell Southgate, PT, DPT, CSRS   END OF SESSION:  PT End of Session - 11/08/24 0939     Visit Number 10    Number of Visits 13   with eval   Date for Recertification  11/12/24   to allow for scheduling delays   Authorization Type Medicare    PT Start Time [phone]    PT Stop Time 1000    PT Time Calculation (min) 23 min    Equipment Utilized During Treatment Gait belt    Activity Tolerance Patient tolerated treatment well    Behavior During Therapy WFL for tasks assessed/performed                 Past Medical History:  Diagnosis Date   Allergic rhinitis    Asthma    Depressed    Headache    migraines   Hepatitis    PMH: HEP A   Hypertension    Kidney stones    Obesity (BMI 30-39.9)    OSA on CPAP    Pneumonia    Restless legs syndrome (RLS)    Past Surgical History:  Procedure Laterality Date   COLONOSCOPY W/ BIOPSIES AND POLYPECTOMY     DISTAL BICEPS TENDON REPAIR Right 08/22/2014   Procedure: RIGHT DISTAL BICEPS TENDON DIRECT PRIMARY REPAIR;  Surgeon: Lonni CINDERELLA Poli, MD;  Location: [facility] OR;  Service: Orthopedics;  Laterality: Right;    Patient Active Problem List   Diagnosis Date Noted   Traumatic rupture of right distal biceps tendon 08/22/2014   HTN (hypertension) 09/05/2013   Obesity (BMI 30-39.9)    Restless legs syndrome (RLS)    Obstructive sleep apnea 08/25/2011   Seasonal and perennial allergic rhinitis 08/21/2007   Mild intermittent asthma in adult without complication 08/21/2007    ONSET DATE: 09/11/2024 (referral date)  REFERRING DIAG: R26.89 (ICD-10-CM) - Other abnormalities of gait and mobility I63.81 (ICD-10-CM) - 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 No questions over edu provided last visit. Pt ready for d/c today!  Pt accompanied by: significant other wife [name]  PERTINENT HISTORY: [name] Brown is a 74 year old male with a history of migraines, sleep apnea, hypertension, depression who presents with vision changes and balance issues, had a recent MRI showing recent strokes.   PAIN:  Are you having pain? No  PRECAUTIONS: Fall  RED FLAGS: None   WEIGHT BEARING RESTRICTIONS: No  FALLS: Has patient fallen in last 6 months? Yes. Number of falls 1, fell in the bedroom - no injuries, was able to get back up himself  LIVING ENVIRONMENT: Lives with: lives with their spouse Lives in: House/apartment Stairs: Yes: Internal: 12 steps; on right going up; wife provides Supervision on stairs Has following equipment at home: CPAP machine  PLOF: Independent  PATIENT GOALS: to get my  balance better  OBJECTIVE:  Note: Objective measures were completed at Evaluation unless otherwise noted.  DIAGNOSTIC FINDINGS:  MRI Brain 08/24/2024 1. Acute infarct in the left subinsular region.  2. Possible small subacute infarct in the right basal ganglia.  3. Chronic microvascular ischemic changes.  4. Mild generalized parenchymal volume loss.     Carotid US  08/24/2024 Right Carotid: There is no evidence of stenosis in the right ICA.  Left Carotid: There is no evidence of stenosis in the left ICA.  Vertebrals:  Bilateral vertebral arteries demonstrate antegrade flow.  Subclavians: Normal flow hemodynamics were seen in bilateral subclavian arteries.   COGNITION: Overall cognitive status: Within functional limits for tasks assessed   SENSATION: No N/T Light touch WFL  COORDINATION: Impaired in BUE and BLE slightly  EDEMA:  R ankle swelling  POSTURE: rounded shoulders and forward head  LOWER EXTREMITY ROM:     Active  Right Eval Left Eval  Hip flexion    Hip extension    Hip abduction    Hip adduction    Hip internal rotation    Hip external rotation    Knee flexion    Knee extension Tight HS Tight HS  Ankle dorsiflexion    Ankle plantarflexion    Ankle inversion    Ankle eversion     (Blank rows = not tested)  LOWER EXTREMITY MMT:    MMT Right Eval Left Eval  Hip flexion 5 4  Hip extension    Hip abduction    Hip adduction    Hip internal rotation    Hip external rotation    Knee flexion 5 4  Knee extension 5 4  Ankle dorsiflexion 5 5  Ankle plantarflexion    Ankle inversion    Ankle eversion    (Blank rows = not tested)  BED MOBILITY:  Feels wobbly when he first stands up, otherwise mod I for bed mobility  TRANSFERS: Sit to stand: SBA  Assistive device utilized: None     Stand to sit: SBA  Assistive device utilized: None     Chair to chair: SBA  Assistive device utilized: None       RAMP:  Not tested  CURB:  Not  tested  STAIRS: STAIRS:  Level of Assistance: CGA  Stair Negotiation Technique: Step to Pattern Alternating Pattern  with Bilateral Rails  Number of Stairs: 4   Height of Stairs: 6  Comments: alternating pattern ascending, step to pattern descending   GAIT: Findings:  GAIT: Gait pattern: scissoring, ataxic, and narrow BOS Distance walked: various  clinic distances Assistive device utilized: None Level of assistance: CGA Comments: slowed gait speed  FUNCTIONAL TESTS:    [facility] PT Assessment - 11/08/24 0954       Ambulation/Gait   Gait velocity 32.8 ft over 10.78 sec = 3.04 ft/sec   no AD     Standardized Balance Assessment   Standardized Balance Assessment Timed Up and Go Test;Five Times Sit to Stand    Five times sit to stand comments  13.53 sec   no UE     Timed Up and Go Test   TUG Normal TUG    Normal TUG (seconds) 8.4   no AD     Functional Gait  Assessment   Gait assessed  Yes    Gait Level Surface Walks 20 ft in less than 7 sec but greater than 5.5 sec, uses assistive device, slower speed, mild gait deviations, or deviates 6-10 in outside of the 12 in walkway width.    Change in Gait Speed Able to smoothly change walking speed without loss of balance or gait deviation. Deviate no more than 6 in outside of the 12 in walkway width.    Gait with Horizontal Head Turns Performs head turns smoothly with no change in gait. Deviates no more than 6 in outside 12 in walkway width    Gait with Vertical Head Turns Performs head turns with no change in gait. Deviates no more than 6 in outside 12 in walkway width.    Gait and Pivot Turn Pivot turns safely within 3 sec and stops quickly with no loss of balance.    Step Over Obstacle Is able to step over 2 stacked shoe boxes taped together (9 in total height) without changing gait speed. No evidence of imbalance.    Gait with Narrow Base of Support Is able to ambulate for 10 steps heel to toe with no staggering.    Gait with Eyes  Closed Walks 20 ft, uses assistive device, slower speed, mild gait deviations, deviates 6-10 in outside 12 in walkway width. Ambulates 20 ft in less than 9 sec but greater than 7 sec.    Ambulating Backwards Walks 20 ft, no assistive devices, good speed, no evidence for imbalance, normal gait    Steps Alternating feet, no rail.    Total Score 28    FGA comment: 28/30           mCTSIB: Condition 1: 30 sec Condition 2: 30 sec Condition 3: 30 sec Condition 4: 1 sec       =91/120                                                                                                                               TREATMENT DATE:    Self-Care/Home Management Vitals:   11/08/24 0942  BP: 128/78  Pulse: 77   BP assessed in LUE in siting at rest, vitals WNL.   Physical Performance For LTG assessment:  [facility] PT Assessment - 11/08/24 0954       Ambulation/Gait   Gait velocity 32.8 ft over 10.78 sec = 3.04 ft/sec   no AD     Standardized Balance Assessment   Standardized Balance Assessment Timed Up and Go Test;Five Times Sit to Stand    Five times sit to stand comments  13.53 sec   no UE     Timed Up and Go Test   TUG Normal TUG    Normal TUG (seconds) 8.4   no AD     Functional Gait  Assessment   Gait assessed  Yes    Gait Level Surface Walks 20 ft in less than 7 sec but greater than 5.5 sec, uses assistive device, slower speed, mild gait deviations, or deviates 6-10 in outside of the 12 in walkway width.    Change in Gait Speed Able to smoothly change walking speed without loss of balance or gait deviation. Deviate no more than 6 in outside of the 12 in walkway width.    Gait with Horizontal Head Turns Performs head turns smoothly with no change in gait. Deviates no more than 6 in outside 12 in walkway width    Gait with Vertical Head Turns Performs head turns with no change in gait. Deviates no more than 6 in outside 12 in walkway width.    Gait and Pivot Turn Pivot turns safely  within 3 sec and stops quickly with no loss of balance.    Step Over Obstacle Is able to step over 2 stacked shoe boxes taped together (9 in total height) without changing gait speed. No evidence of imbalance.    Gait with Narrow Base of Support Is able to ambulate for 10 steps heel to toe with no staggering.    Gait with Eyes Closed Walks 20 ft, uses assistive device, slower speed, mild gait deviations, deviates 6-10 in outside 12 in walkway width. Ambulates 20 ft in less than 9 sec but greater than 7 sec.    Ambulating Backwards Walks 20 ft, no assistive devices, good speed, no evidence for imbalance, normal gait    Steps Alternating feet, no rail.    Total Score 28    FGA comment: 28/30           PATIENT EDUCATION: Education details: continue HEP, results of OM and functional implications Person educated: Patient and Spouse Education method: Explanation, Demonstration, Tactile cues, and Verbal cues Education comprehension: verbalized understanding and returned demonstration  HOME EXERCISE PROGRAM: Access Code: [email or web address] URL: [email or web address] Date: 09/25/2024 Prepared by: Waddell Southgate  Exercises - Sit to Stand Without Arm Support  - 1 x daily - 7 x weekly - 3 sets - 10 reps - Staggered Sit-to-Stand  - 1 x daily - 7 x weekly - 3 sets - 10 reps - Squat with Chair Touch  - 1 x daily - 7 x weekly - 3 sets - 10 reps - Side Stepping with Resistance at Ankles and Counter Support  - 1 x daily - 7 x weekly - 3 sets - 10 reps - Standing Balance with Eyes Closed on Foam  - 1 x daily - 7 x weekly - 1 sets - 3-5 reps - 30 sec hold   GOALS: Goals reviewed with patient? Yes  SHORT TERM GOALS: Target date: 10/08/2024   Pt will be independent with initial HEP for improved strength, balance, transfers and gait. Baseline: pt needs reminders from his wife to complete due to STM deficits (12/16) Goal  status: IN PROGRESS  2.  Pt will improve FGA to 12/30 for decreased fall  risk  Baseline: 8/30 (11/17), 17/30 (12/16) Goal status: MET  3.  Pt will improve Condition 4 on mCTSIB to 15 sec for improved balance Baseline: 1 sec (11/17), 11.6 sec average (12/16) Goal status: IN PROGRESS   LONG TERM GOALS: Target date: 11/08/2024 (updated to match last scheduled visit within POC)   Pt will be independent with final HEP for improved strength, balance, transfers and gait. Baseline:  Goal status: MET  2.  Pt will improve 5 x STS to less than or equal to 24 seconds to demonstrate improved functional strength and transfer efficiency.  Baseline: 27 sec no UE (11/17), 13.53 sec no UE (1/8) Goal status: MET  3.  Pt will improve gait velocity to at least 2.25 ft/sec for improved gait efficiency and performance at mod I level  Baseline: 1.97 ft/sec SBA (11/17), 3.04 ft/sec mod I (1/8) Goal status: MET  4.  Pt will improve normal TUG to less than or equal to 15 seconds for improved functional mobility and decreased fall risk. Baseline: 18.69 sec no AD (111/7), 8.4 sec no AD (1/8) Goal status: MET  5.  Pt will improve FGA to 21/30 for decreased fall risk  Baseline: 8/30 (11/17), 17/30 (12/16), 28/30 (1/8) Goal status: MET  6.  Pt will improve Condition 4 on mCTSIB to 30 sec for improved balance Baseline: 1 sec (11/17), 11.6 sec average (12/16), 30 sec (1/8) Goal status: MET  ASSESSMENT:  CLINICAL IMPRESSION: Emphasis of skilled PT session on continuing to assess vitals and assessing LTG with planned d/c from OPPT services this date. Pt's vitals remain WFL. Pt has met 6/6 LTG due to being independent with his final HEP, improving his 5xSTS score, improving his gait speed, improving his TUG score, improving his FGA score, and improving Condition 4 of the mCTSIB. He has made great progress in therapy and is now a low fall risk. He is pleased with his progress and agreeable to d/c from OPPT services this date.   OBJECTIVE IMPAIRMENTS: Abnormal gait, decreased balance,  decreased coordination, difficulty walking, decreased strength, impaired perceived functional ability, and impaired vision/preception.   ACTIVITY LIMITATIONS: carrying, lifting, bending, squatting, stairs, and bed mobility  PARTICIPATION LIMITATIONS: meal prep, cleaning, laundry, medication management, interpersonal relationship, driving, and community activity  PERSONAL FACTORS: Age and 3+ comorbidities:   migraines, sleep apnea, hypertension, depressionare also affecting patient's functional outcome.   REHAB POTENTIAL: Good  CLINICAL DECISION MAKING: Evolving/moderate complexity  EVALUATION COMPLEXITY: Moderate  PLAN: discharge from PT   Waddell Southgate, PT Waddell Southgate, PT, DPT, CSRS   11/08/2024, 10:01 AM        "

## 2024-11-12 ENCOUNTER — Encounter: Payer: Self-pay | Admitting: Cardiovascular Disease

## 2024-11-12 ENCOUNTER — Ambulatory Visit: Attending: Cardiovascular Disease | Admitting: Cardiovascular Disease

## 2024-11-12 VITALS — BP 114/70 | HR 94 | Ht 70.0 in | Wt 210.0 lb

## 2024-11-12 DIAGNOSIS — I442 Atrioventricular block, complete: Secondary | ICD-10-CM | POA: Diagnosis not present

## 2024-11-12 DIAGNOSIS — I6381 Other cerebral infarction due to occlusion or stenosis of small artery: Secondary | ICD-10-CM | POA: Insufficient documentation

## 2024-11-12 NOTE — Patient Instructions (Signed)
 Medication Instructions:  Your physician recommends that you continue on your current medications as directed. Please refer to the Current Medication list given to you today.  *If you need a refill on your cardiac medications before your next appointment, please call your pharmacy*  Lab Work: None ordered.  If you have labs (blood work) drawn today and your tests are completely normal, you will receive your results only by: MyChart Message (if you have MyChart) OR A paper copy in the mail If you have any lab test that is abnormal or we need to change your treatment, we will call you to review the results.  Testing/Procedures: Dr Nancey would like to implant a loop recorder.   Follow-Up: At [facility], you and your health needs are our priority.  As part of our continuing mission to provide you with exceptional heart care, our providers are all part of one team.  This team includes your primary Cardiologist (physician) and Advanced Practice Providers or APPs (Physician Assistants and Nurse Practitioners) who all work together to provide you with the care you need, when you need it.  Your next appointment:   To be scheduled

## 2024-11-12 NOTE — Progress Notes (Signed)
 " Electrophysiology Office Note:    Date:  11/12/2024   ID:  [name] Brown, DOB Jan 03, 1951, MRN [id number]  PCP:  Arloa Elsie SAUNDERS, MD   [facility] Providers Cardiologist:  None     Referring MD: Arloa Elsie SAUNDERS, MD   History of Present Illness:    [name] Brown is a 74 y.o. male with a medical history significant for stroke concerning for cardioembolic etiology, referred for rhythm management.     He is currently wearing a monitor for detection of atrial fibrillation after suffering a stroke.  We received an alert that he had a pause lasting 3.5 seconds on September 13, 2024.  The patient's wife reported that he was having a CT scan of the brain at the time of his falls.  Discussed the use of AI scribe software for clinical note transcription with the patient, who gave verbal consent to proceed.  History of Present Illness  In October, he experienced a stroke with imaging revealing infarcts on both sides of the brain. During cardiac monitoring, bradycardia was noted, with temporary slowing of the heart rate occurring around the time of a CT scan. He did not experience lightheadedness or syncope, although he almost dozed off during the scan.  He has never had an episode of unexplained lightheadedness or near syncope         Today, he reports that he is at baseline and has no acute complaints.  He presents today due to follow-up on his wearable monitor.  EKGs/Labs/Other Studies Reviewed Today:     Echocardiogram:  TTE August 30, 2024 LVEF 60 to 65%.  Grade 1 diastolic dysfunction.  Prominent lipomatous atrial septal hypertrophy.   Monitors:  30 day monitor December 2025-- my interpretation Sinus rhythm, heart rate 33 232 beats minute, average 77 beats minute no sustained arrhythmias detected.  A single 3.5-second pause    EKG:   EKG Interpretation Date/Time:  Monday November 12 2024 14:30:39 EST Ventricular Rate:  94 PR Interval:  206 QRS  Duration:  82 QT Interval:  332 QTC Calculation: 415 R Axis:   45  Text Interpretation: Sinus rhythm with Premature atrial complexes Nonspecific ST abnormality When compared with ECG of 19-Sep-2024 11:06, Premature atrial complexes are now Present Confirmed by Nancey Scotts [phone]) on 11/12/2024 2:52:15 PM     Physical Exam:    VS:  BP 114/70 (BP Location: Left Arm, Patient Position: Sitting, Cuff Size: Large)   Pulse 94   Ht 5' 10 (1.778 m)   Wt 210 lb (95.3 kg)   SpO2 96%   BMI 30.13 kg/m     Wt Readings from Last 3 Encounters:  11/12/24 210 lb (95.3 kg)  09/19/24 218 lb 3.2 oz (99 kg)  08/28/24 217 lb (98.4 kg)     GEN: Well nourished, well developed in no acute distress CARDIAC: RRR, no murmurs, rubs, gallops RESPIRATORY:  Normal work of breathing MUSCULOSKELETAL: no edema    ASSESSMENT & PLAN:     Incidental bradycardia noted on monitor No symptoms associate with episode, no history of lightheadedness 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  We discussed indication loop recorder. Explained risks  including infection, bleeding, device dislodgment.  I explained the monitoring process with the associated fee.  He would like to proceed.  Signed, Eulas FORBES Furbish, MD  11/12/2024 2:52 PM    [facility] "

## 2024-11-13 ENCOUNTER — Ambulatory Visit

## 2024-11-13 DIAGNOSIS — R2689 Other abnormalities of gait and mobility: Secondary | ICD-10-CM | POA: Diagnosis not present

## 2024-11-13 DIAGNOSIS — R41841 Cognitive communication deficit: Secondary | ICD-10-CM

## 2024-11-13 DIAGNOSIS — R4701 Aphasia: Secondary | ICD-10-CM

## 2024-11-13 NOTE — Therapy (Signed)
 " OUTPATIENT SPEECH LANGUAGE PATHOLOGY APHASIA TREATMENT   Patient Name: [name] Brown MRN: [id number] DOB:Jul 27, 1951, 74 y.o., male Today's Date: 11/13/2024  PCP: Arloa Elsie SAUNDERS, MD REFERRING PROVIDER: Cristopher Bottcher, NP  END OF SESSION:  End of Session - 11/13/24 1145     Visit Number 4    Number of Visits 17    Date for Recertification  12/11/24    SLP Start Time 1104    SLP Stop Time  1146    SLP Time Calculation (min) 42 min    Activity Tolerance Patient tolerated treatment well             Past Medical History:  Diagnosis Date   Allergic rhinitis    Asthma    Depressed    Headache    migraines   Hepatitis    PMH: HEP A   Hypertension    Kidney stones    Obesity (BMI 30-39.9)    OSA on CPAP    Pneumonia    Restless legs syndrome (RLS)    Past Surgical History:  Procedure Laterality Date   COLONOSCOPY W/ BIOPSIES AND POLYPECTOMY     DISTAL BICEPS TENDON REPAIR Right 08/22/2014   Procedure: RIGHT DISTAL BICEPS TENDON DIRECT PRIMARY REPAIR;  Surgeon: Lonni CINDERELLA Poli, MD;  Location: [facility] OR;  Service: Orthopedics;  Laterality: Right;   Patient Active Problem List   Diagnosis Date Noted   Traumatic rupture of right distal biceps tendon 08/22/2014   HTN (hypertension) 09/05/2013   Obesity (BMI 30-39.9)    Restless legs syndrome (RLS)    Obstructive sleep apnea 08/25/2011   Seasonal and perennial allergic rhinitis 08/21/2007   Mild intermittent asthma in adult without complication 08/21/2007    ONSET DATE: 09/11/2024 (referral date)   REFERRING DIAG:  R41.3 (ICD-10-CM) - Other amnesia  R47.01 (ICD-10-CM) - Aphasia  R26.89 (ICD-10-CM) - Other abnormalities of gait and mobility  I63.81 (ICD-10-CM) - 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 Pt accompanied by: self and significant other  PERTINENT HISTORY: [name] Brown is a 74 year old male with a history of migraines, sleep apnea, hypertension, depression who presents with vision changes and balance issues, had a recent MRI showing recent strokes. Pt note that he will have thoughts and sometimes getting those thoughts out does not work. The flow of commu. 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 Pt notes that he golfs frequently with friends, which is major communication opportunity for pt. Pt reads currently; however, he reads less now compared to prior to strokes. Pt's wife notes that pt's memory has changed since strokes. She states that her and pt have started implementing memory strategies (making a designated locations for specific medications/certain items). Pt's wife note that pt's swallowing with pills is a bit more labored. However, swallowing is not a major concern for pt at this time. (Clinical swallow evaluation may be completed in upcoming session to check overall status).   PAIN:  Are you having pain? No  FALLS: Has patient fallen in last 6 months?  Yes Pt fell twice in the past month.   LIVING ENVIRONMENT: Lives with: lives with their family Lives in: House/apartment  PLOF:  Level of assistance: Independent with ADLs Employment: Retired Since 2020.   PATIENT GOALS: To  improve communication, reading, and short-term memory skills.   OBJECTIVE:  Note: Objective measures were completed at Evaluation unless otherwise noted.  DIAGNOSTIC FINDINGS: FINDINGS:   CT HEAD: BRAIN: Small left insula infarct on MRI last month is occult by CT. More chronic appearing bilateral basal ganglia heterogeneity.   New hypodensity in the medial left deep gray nuclei outer and near the genu of the internal capsule (series 3 image 16). Similar confluent hypodensity at the anterior right corona radiata is also new on  series 3 image 19.   No associated acute intracranial hemorrhage or mass effect. No mass Lesion. No midline shift or extra-axial collection. No evidence of acute cortically based infarct.   VENTRICLES: No hydrocephalus.   ORBITS: The orbits are unremarkable.   SINUSES AND MASTOIDS: The paranasal sinuses and mastoid air cells are clear.   CTA HEAD: INTERNAL CAROTID ARTERIES: Distal cervical ICAs are patent at the skull base. Both ICA siphons are patent. Left ICA siphon is mildly to moderately tortuous with no significant plaque or stenosis. Normal left posterior communicating artery origin. Similar mild to moderate tortuosity of the right ICA siphon with no plaque or stenosis. Normal right posterior communicating artery origin. The intracranial ICAs are patent with no significant stenosis. No occlusion. No aneurysm.   ANTERIOR CEREBRAL ARTERIES: Normal ACA origins. Diminutive or absent anterior communicating artery. Bilateral ACA branches are within normal limits. No significant stenosis. No occlusion. No aneurysm.   MIDDLE CEREBRAL ARTERIES: Normal MCA origins. Bilateral MCA M1 segments appear patent and normal. Left MCA bifurcation and right MCA trifurcation appear patent and normal. Bilateral MCA branches are within normal limits. No significant stenosis. No occlusion. No aneurysm.   POSTERIOR CEREBRAL ARTERIES: Fetal type bilateral PCA origins, normal variant. Bilateral PCA branches appear normal. No significant stenosis. No occlusion. No aneurysm.   BASILAR ARTERY: Patent basilar artery is diminutive, but without stenosis. Basilar tip and SCA origins are within normal limits. No occlusion. No aneurysm.   VERTEBRAL ARTERIES: Visible distal vertebral arteries and vertebrobasilar junction are patent. Distal right vertebral artery appears mildly dominant and tortuous. Patent PICA and/or AICA are visible. No significant stenosis. No occlusion. No aneurysm.    OTHER: Major dural venous sinuses are enhancing and appear to be patent.   SOFT TISSUES: No acute finding. No masses or lymphadenopathy.   BONES: No acute osseous abnormality.   IMPRESSION: 1. Negative intracranial CTA; some generalized arterial tortuosity with No intracranial atherosclerosis or stenosis. 2. But positive for Progressive small vessel ischemia since the MRI last month; New lacunar type infarcts in both the left deep gray nuclei and the right corona radiata. No associated acute intracranial hemorrhage or mass effect.  COGNITION: Overall cognitive status: Impaired Areas of impairment:  Memory: Impaired: Working Short term Functional deficits: Pt is losing important items.  AUDITORY COMPREHENSION: Overall auditory comprehension: Appears intact YES/NO questions: Appears intact Following directions: Appears intact Conversation: Complex Interfering components: processing speed and working memory Effective technique: extra processing time  READING COMPREHENSION: Impaired: paragraph  EXPRESSION: verbal  VERBAL EXPRESSION: Level of generative/spontaneous verbalization: conversation Automatic speech: name: intact, social response: intact, and counting: intact  Repetition: Impaired: sentence Naming: Confrontation: 76-100% Pragmatics: Appears intact Comments: Pt notes that his verbal expression is slower than prior to strokes. 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  Interfering components: N/A Effective technique: To be explored in upcoming sessions. Non-verbal means of communication: N/A  WRITTEN EXPRESSION: Dominant hand: right Written expression: Appears intact  MOTOR  SPEECH: Overall motor speech: Appears intact   ORAL MOTOR EXAMINATION: Overall status: Did not assess Comments: N/A  STANDARDIZED ASSESSMENTS: CLQT: Attention: Mild, Memory: WNL, Executive Function: Mild, Language: Mild, Visuospatial Skills: Mild, and Clock  Drawing: Mild and QAB: Mild  PATIENT REPORTED OUTCOME MEASURES (PROM): Communication Participation Item Bank: 10/30 The Communicative Participation Item Bank        Does your condition interfere with... Pt Rating   ...talking with people you know 1   ...communicating when you need to say something quickly 1   ...talking with people you do not know 1   ...communicating when you are out in your community 1   ...asking questions in a conversation 1   ....communicating in a small group of people 1   ...having a long conversation 1   ...giving detailed infomrmation 1   ...getting your turn in a fast moving conversation 1   ...trying to persuade a friend or family member to see a different point of view 1  3= Not at all; 2=A little; 1=Quite a bit; 0=Very much                                                                                                                             TREATMENT DATE:  11/13/24: Pt's wife was present for session. Pt feels that his communication is improving. Pt's wife notes that pt is more talkative just like he was prior to stroke. However, pt's wife and pt notice continued short-term memory deficits.  To target anomia, Scientist, Product/process Development (VNeST) was utilized. The pt generated subjects and objects for 3 verbs (play, fix, help), for a total of 14 subject and objects. Pt required frequent min cues. Pt generated 3 complex sentences by answering wh questions. Pt required frequent min cues to generate complex sentences. Pt displayed emerging understanding of how to independently perform VNeST. Semantic Feature Analysis (SFA) was reviewed using pictures of actions rather than objects to maximize pt's thought organization and word-finding. Across 2 trials, pt provided 6/6 descriptions related to actions given occasional min verbal and visual A. SLP updated pt's HEP to include VNeST and SFA with actions. Plan is to continue aphasia tx and to initiate  short-term memory compensations for optimizing pt's participation in ADLs.    10/31/24: Aphasia tx: SLP targeted naming and anomia compensation of description through [employer]. SLP provided education on use of SFA as rehabilitation tool to improve lexical representation with improved word finding, use as structure to aid in description in conversation, and as self-cueing strategy. Given visual target, pt able to name 3/3 on initial attempt. Pt able to generate average of 6/6 salient features given rare min-A. Pt benefited from having to expand answers into longer, sentence length utterances for additional practice. Plan is to introduce Occupational Psychologist (VNeST).    Cognitive evaluation: Pt finished CLQT. SLP educated pt about final results and initiated education about utilizing visual cues for optimizing  short-term memory and sustained attention to ADLs. Pt's wife notes that she noticed pt have aninattentive episode in the previous night; pt started a new topic of conversation, only to drop it after pt's wife left the room briefly. Plan is to continue instruction of compensatory cognitive strategies for maximizing cognitive-communication skills in ADLs for improving pt QOL.  10/29/24: Aphasia tx: SLP introduced Semantic Feature Analysis for optimizing pt's word-finding during conversation. SLP guided pt through how to perform SFA and allow pt independent opportunities for saying and writing descriptions during SFA. Pt successfully completed 4/4 trials of SFA using 6+ descriptions given occasional min A. SLP provided pt HEP focused on SFA for continued practice for minimizing communication breakdown due to anomic pauses. Plan is to continue SFA and introduce further compensations for word-finding difficulties for optimizing pt's communicative effectiveness.   Cognitive evaluation: Pt completed further portions of the CLQT (symbol trails, clock drawing, generative  naming, story retelling). Plan is to finish CLQT in upcoming session due to time constraints.   10/16/24: Evaluation in progress. Pt completed QAB and aphasia PROM. CLQT was initiated and will be completed in upcoming session, along with cognition PROM. No tx completed on this date.     PATIENT EDUCATION: Education details: Definition of aphasia and cognitive-communication disorders.  Person educated: Patient and Spouse Education method: Explanation and Handouts Education comprehension: verbalized understanding and needs further education   GOALS: Goals reviewed with patient? Yes  SHORT TERM GOALS: Target date: 11/12/24  Pt will complete CLQT+. Baseline: Goal status: MET  2.  Pt will complete a PROM for cognition.  Baseline:  Goal status: Deferred.  3.  Pt will utilize circumlocution and other communication strategies for optimizing pt communication during a 12 minute conversation given occasional min A. Baseline:  Goal status: ONGOING  4.  Pt will read and comprehend a 4 paragraph text in <12 minutes using reading strategies (such as PQRST or other relevant alexia techniques) for improving pt QOL given occasional min A. Baseline:  Goal status: ONGOING  5.  Pt will demonstrate use of at least 1 external memory strategy (memory journal, wall calendar, etc.) across 2-3 sessions given occasional min A.  Baseline:  Goal status: ONGOING  6.  Pt will complete clinical swallow evaluation (if indicated). Baseline:  Goal status: ONGOING  LONG TERM GOALS: Target date: 12/11/24  Pt will participate in complex conversation with <3 anomic pauses, using communication support strategies given rare min A. Baseline:  Goal status: ONGOING  2.  Pt will utilize at least 2 memory strategies for optimizing short-term memory recall given rare min A. Baseline:  Goal status: ONGOING  3.  Pt will comprehend moderately-complex texts pertinent to pt in 90% of opportunities given rare min  A. Baseline:  Goal status: ONGOING  4.  Pt will improve score on CPIB. Baseline: 10/30 Goal status: ONGOING  5.  TBD Baseline:  Goal status: ONGOING  6.  TBD Baseline:  Goal status: ONGOING  ASSESSMENT:  CLINICAL IMPRESSION: Patient is a 74 y.o. male who was seen today for aphasia and cognitive-communication evaluation s/p multiple strokes. Pt presents with mild aphasia characterized by increased anomia and reduced language content according pt and pt's spouse. 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 Pt notes that communication is more labored and that he does not use as many words as he use to during conversations. Pt's reading skills have decreased compared to prior to the strokes according  to pt. Pt states that he could use to read multiple paragraphs without thinking about it; however, pt now notices that he requires multiple re-readings to comprehend texts. Pt reads for fun. In addition to aphasia, pt presents with cognitive-communication deficits characterized by short-term memory impairment. Pt's spouse notes that pt's short-term memory recall is reduced compared ot prior to strokes. Pt also endorses this fact. CLQT is currently in progress and will provide more information about pt's cognitive-communication skills.   ST services are recommended due to significant impacts of current deficits on pt's communication at home and in public. According to PROM, pt rated every prompt with a score of 1, which notes quite a bit of effort with using his language in multiple communication contexts. Additionally, pt needs ST services to optimize safety at home due to pt-reported short-term memory deficits. Without skilled services, pt is at risk of reduced safety and social engagement in multiple communication contexts, which all significantly impact QOL.  OBJECTIVE IMPAIRMENTS: include memory, expressive language, and aphasia. These impairments  are limiting patient from ADLs/IADLs and effectively communicating at home and in community. Factors affecting potential to achieve goals and functional outcome are N/A. Patient will benefit from skilled SLP services to address above impairments and improve overall function.  REHAB POTENTIAL: Excellent  PLAN:  SLP FREQUENCY: 2x/week  SLP DURATION: 8 weeks  PLANNED INTERVENTIONS: Language facilitation, Environmental controls, Cognitive reorganization, Internal/external aids, Functional tasks, SLP instruction and feedback, Compensatory strategies, Patient/family education, and [id number] Treatment of speech (30 or 45 min)     Waddell Music, M.A., CF-SLP 11/13/2024, 11:46 AM      "

## 2024-11-16 ENCOUNTER — Ambulatory Visit

## 2024-11-20 ENCOUNTER — Ambulatory Visit

## 2024-11-20 DIAGNOSIS — R41841 Cognitive communication deficit: Secondary | ICD-10-CM

## 2024-11-20 DIAGNOSIS — R2689 Other abnormalities of gait and mobility: Secondary | ICD-10-CM | POA: Diagnosis not present

## 2024-11-20 DIAGNOSIS — R4701 Aphasia: Secondary | ICD-10-CM

## 2024-11-20 NOTE — Therapy (Signed)
 " OUTPATIENT SPEECH LANGUAGE PATHOLOGY APHASIA TREATMENT   Patient Name: [name] Brown MRN: [id number] DOB:[birth year], 74 y.o., male Today's Date: 11/20/2024  PCP: Arloa Elsie SAUNDERS, MD REFERRING PROVIDER: Cristopher Bottcher, NP  END OF SESSION:  End of Session - 11/20/24 1004     Visit Number 5    Number of Visits 17    Date for Recertification  12/11/24    SLP Start Time 0933    SLP Stop Time  1004    SLP Time Calculation (min) 31 min    Activity Tolerance Patient tolerated treatment well              Past Medical History:  Diagnosis Date   Allergic rhinitis    Asthma    Depressed    Headache    migraines   Hepatitis    PMH: HEP A   Hypertension    Kidney stones    Obesity (BMI 30-39.9)    OSA on CPAP    Pneumonia    Restless legs syndrome (RLS)    Past Surgical History:  Procedure Laterality Date   COLONOSCOPY W/ BIOPSIES AND POLYPECTOMY     DISTAL BICEPS TENDON REPAIR Right 08/22/2014   Procedure: RIGHT DISTAL BICEPS TENDON DIRECT PRIMARY REPAIR;  Surgeon: Lonni CINDERELLA Poli, MD;  Location: [facility] OR;  Service: Orthopedics;  Laterality: Right;   Patient Active Problem List   Diagnosis Date Noted   Traumatic rupture of right distal biceps tendon 08/22/2014   HTN (hypertension) 09/05/2013   Obesity (BMI 30-39.9)    Restless legs syndrome (RLS)    Obstructive sleep apnea 08/25/2011   Seasonal and perennial allergic rhinitis 08/21/2007   Mild intermittent asthma in adult without complication 08/21/2007    ONSET DATE: 09/11/2024 (referral date)   REFERRING DIAG:  R41.3 (ICD-10-CM) - Other amnesia  R47.01 (ICD-10-CM) - Aphasia  R26.89 (ICD-10-CM) - Other abnormalities of gait and mobility  I63.81 (ICD-10-CM) - 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 Pt accompanied by: self and significant other  PERTINENT HISTORY: MARCELLOUS SNARSKI is a 74 year old male with a history of migraines, sleep apnea, hypertension, depression who presents with vision changes and balance issues, had a recent MRI showing recent strokes. Pt note that he will have thoughts and sometimes getting those thoughts out does not work. The flow of commu. 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 Pt notes that he golfs frequently with friends, which is major communication opportunity for pt. Pt reads currently; however, he reads less now compared to prior to strokes. Pt's wife notes that pt's memory has changed since strokes. She states that her and pt have started implementing memory strategies (making a designated locations for specific medications/certain items). Pt's wife note that pt's swallowing with pills is a bit more labored. However, swallowing is not a major concern for pt at this time. (Clinical swallow evaluation may be completed in upcoming session to check overall status).   PAIN:  Are you having pain? No  FALLS: Has patient fallen in last 6 months?  Yes Pt fell twice in the past month.   LIVING ENVIRONMENT: Lives with: lives with their family Lives in: House/apartment  PLOF:  Level of assistance: Independent with ADLs Employment: Retired Since 2020.   PATIENT GOALS:  To improve communication, reading, and short-term memory skills.   OBJECTIVE:  Note: Objective measures were completed at Evaluation unless otherwise noted.  DIAGNOSTIC FINDINGS: FINDINGS:   CT HEAD: BRAIN: Small left insula infarct on MRI last month is occult by CT. More chronic appearing bilateral basal ganglia heterogeneity.   New hypodensity in the medial left deep gray nuclei outer and near the genu of the internal capsule (series 3 image 16). Similar confluent hypodensity at the anterior right corona radiata is also new on  series 3 image 19.   No associated acute intracranial hemorrhage or mass effect. No mass Lesion. No midline shift or extra-axial collection. No evidence of acute cortically based infarct.   VENTRICLES: No hydrocephalus.   ORBITS: The orbits are unremarkable.   SINUSES AND MASTOIDS: The paranasal sinuses and mastoid air cells are clear.   CTA HEAD: INTERNAL CAROTID ARTERIES: Distal cervical ICAs are patent at the skull base. Both ICA siphons are patent. Left ICA siphon is mildly to moderately tortuous with no significant plaque or stenosis. Normal left posterior communicating artery origin. Similar mild to moderate tortuosity of the right ICA siphon with no plaque or stenosis. Normal right posterior communicating artery origin. The intracranial ICAs are patent with no significant stenosis. No occlusion. No aneurysm.   ANTERIOR CEREBRAL ARTERIES: Normal ACA origins. Diminutive or absent anterior communicating artery. Bilateral ACA branches are within normal limits. No significant stenosis. No occlusion. No aneurysm.   MIDDLE CEREBRAL ARTERIES: Normal MCA origins. Bilateral MCA M1 segments appear patent and normal. Left MCA bifurcation and right MCA trifurcation appear patent and normal. Bilateral MCA branches are within normal limits. No significant stenosis. No occlusion. No aneurysm.   POSTERIOR CEREBRAL ARTERIES: Fetal type bilateral PCA origins, normal variant. Bilateral PCA branches appear normal. No significant stenosis. No occlusion. No aneurysm.   BASILAR ARTERY: Patent basilar artery is diminutive, but without stenosis. Basilar tip and SCA origins are within normal limits. No occlusion. No aneurysm.   VERTEBRAL ARTERIES: Visible distal vertebral arteries and vertebrobasilar junction are patent. Distal right vertebral artery appears mildly dominant and tortuous. Patent PICA and/or AICA are visible. No significant stenosis. No occlusion. No aneurysm.    OTHER: Major dural venous sinuses are enhancing and appear to be patent.   SOFT TISSUES: No acute finding. No masses or lymphadenopathy.   BONES: No acute osseous abnormality.   IMPRESSION: 1. Negative intracranial CTA; some generalized arterial tortuosity with No intracranial atherosclerosis or stenosis. 2. But positive for Progressive small vessel ischemia since the MRI last month; New lacunar type infarcts in both the left deep gray nuclei and the right corona radiata. No associated acute intracranial hemorrhage or mass effect.  COGNITION: Overall cognitive status: Impaired Areas of impairment:  Memory: Impaired: Working Short term Functional deficits: Pt is losing important items.  AUDITORY COMPREHENSION: Overall auditory comprehension: Appears intact YES/NO questions: Appears intact Following directions: Appears intact Conversation: Complex Interfering components: processing speed and working memory Effective technique: extra processing time  READING COMPREHENSION: Impaired: paragraph  EXPRESSION: verbal  VERBAL EXPRESSION: Level of generative/spontaneous verbalization: conversation Automatic speech: name: intact, social response: intact, and counting: intact  Repetition: Impaired: sentence Naming: Confrontation: 76-100% Pragmatics: Appears intact Comments: Pt notes that his verbal expression is slower than prior to strokes. 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  Interfering components: N/A Effective technique: To be explored in upcoming sessions. Non-verbal means of communication: N/A  WRITTEN EXPRESSION: Dominant hand: right Written expression: Appears intact  MOTOR SPEECH: Overall motor speech: Appears intact   ORAL MOTOR EXAMINATION: Overall status: Did not assess Comments: N/A  STANDARDIZED ASSESSMENTS: CLQT: Attention: Mild, Memory: WNL, Executive Function: Mild, Language: Mild, Visuospatial Skills: Mild, and Clock  Drawing: Mild and QAB: Mild  PATIENT REPORTED OUTCOME MEASURES (PROM): Communication Participation Item Bank: 10/30 The Communicative Participation Item Bank        Does your condition interfere with... Pt Rating   ...talking with people you know 1   ...communicating when you need to say something quickly 1   ...talking with people you do not know 1   ...communicating when you are out in your community 1   ...asking questions in a conversation 1   ....communicating in a small group of people 1   ...having a long conversation 1   ...giving detailed infomrmation 1   ...getting your turn in a fast moving conversation 1   ...trying to persuade a friend or family member to see a different point of view 1  3= Not at all; 2=A little; 1=Quite a bit; 0=Very much                                                                                                                             TREATMENT DATE:  11/20/24: Pt's wife was present for session. Pt continues to feel more talkative. Pt notes that he is having a hard time remembering how to use remote and remembering where he puts things. SLP instructed pt and pt's wife in using external memory supports (dump basket) for consolidating items to improve short-term memory recall. Pt's wife notes that the pt tends to accidentally talk to the younger children in the family like they are much older, potentially due to being distracted. SLP recommended using visual cues (sticky notes, notepad) to leave reminders for pt to improve alternating attention for short-term memory when communicating with the younger kids. SLP educated pt about strategies for optimizing cognitive health, including cognitive stimulation. Pt is agreeable to trying new activities to support cognitive stimulation for wellbeing. Pt is close to reaching goals for d/c. Along with strategies provided during session, SLP gave pt additional HEP focused on SFA with actions. Plan is to  continue cognitive-communication support strategies for maximizing pt QOL.   11/13/24: Pt's wife was present for session. Pt feels that his communication is improving. Pt's wife notes that pt is more talkative just like he was prior to stroke. However, pt's wife and pt notice continued short-term memory deficits.  To target anomia, Scientist, Product/process Development (VNeST) was utilized. The pt generated subjects and objects for 3 verbs (play, fix, help), for a total of 14 subject and objects. Pt required frequent min cues. Pt generated 3 complex sentences by answering wh questions. Pt required frequent min cues to generate complex sentences. Pt displayed emerging understanding of how to independently perform VNeST. Semantic Feature Analysis (SFA) was reviewed using pictures of actions rather than objects to maximize  pt's thought organization and word-finding. Across 2 trials, pt provided 6/6 descriptions related to actions given occasional min verbal and visual A. SLP updated pt's HEP to include VNeST and SFA with actions. Plan is to continue aphasia tx and to initiate short-term memory compensations for optimizing pt's participation in ADLs.    10/31/24: Aphasia tx: SLP targeted naming and anomia compensation of description through [employer]. SLP provided education on use of SFA as rehabilitation tool to improve lexical representation with improved word finding, use as structure to aid in description in conversation, and as self-cueing strategy. Given visual target, pt able to name 3/3 on initial attempt. Pt able to generate average of 6/6 salient features given rare min-A. Pt benefited from having to expand answers into longer, sentence length utterances for additional practice. Plan is to introduce Occupational Psychologist (VNeST).    Cognitive evaluation: Pt finished CLQT. SLP educated pt about final results and initiated education about utilizing visual cues for  optimizing short-term memory and sustained attention to ADLs. Pt's wife notes that she noticed pt have aninattentive episode in the previous night; pt started a new topic of conversation, only to drop it after pt's wife left the room briefly. Plan is to continue instruction of compensatory cognitive strategies for maximizing cognitive-communication skills in ADLs for improving pt QOL.  10/29/24: Aphasia tx: SLP introduced Semantic Feature Analysis for optimizing pt's word-finding during conversation. SLP guided pt through how to perform SFA and allow pt independent opportunities for saying and writing descriptions during SFA. Pt successfully completed 4/4 trials of SFA using 6+ descriptions given occasional min A. SLP provided pt HEP focused on SFA for continued practice for minimizing communication breakdown due to anomic pauses. Plan is to continue SFA and introduce further compensations for word-finding difficulties for optimizing pt's communicative effectiveness.   Cognitive evaluation: Pt completed further portions of the CLQT (symbol trails, clock drawing, generative naming, story retelling). Plan is to finish CLQT in upcoming session due to time constraints.   10/16/24: Evaluation in progress. Pt completed QAB and aphasia PROM. CLQT was initiated and will be completed in upcoming session, along with cognition PROM. No tx completed on this date.     PATIENT EDUCATION: Education details: Definition of aphasia and cognitive-communication disorders.  Person educated: Patient and Spouse Education method: Explanation and Handouts Education comprehension: verbalized understanding and needs further education   GOALS: Goals reviewed with patient? Yes  SHORT TERM GOALS: Target date: 11/12/24  Pt will complete CLQT+. Baseline: Goal status: MET  2.  Pt will complete a PROM for cognition.  Baseline:  Goal status: Deferred.  3.  Pt will utilize circumlocution and other communication  strategies for optimizing pt communication during a 12 minute conversation given occasional min A. Baseline:  Goal status: ONGOING  4.  Pt will read and comprehend a 4 paragraph text in <12 minutes using reading strategies (such as PQRST or other relevant alexia techniques) for improving pt QOL given occasional min A. Baseline:  Goal status: ONGOING  5.  Pt will demonstrate use of at least 1 external memory strategy (memory journal, wall calendar, etc.) across 2-3 sessions given occasional min A.  Baseline:  Goal status: ONGOING  6.  Pt will complete clinical swallow evaluation (if indicated). Baseline:  Goal status: ONGOING  LONG TERM GOALS: Target date: 12/11/24  Pt will participate in complex conversation with <3 anomic pauses, using communication support strategies given rare min A. Baseline:  Goal status: ONGOING  2.  Pt will utilize at least 2 memory strategies for optimizing short-term memory recall given rare min A. Baseline:  Goal status: ONGOING  3.  Pt will comprehend moderately-complex texts pertinent to pt in 90% of opportunities given rare min A. Baseline:  Goal status: ONGOING  4.  Pt will improve score on CPIB. Baseline: 10/30 Goal status: ONGOING  5.  TBD Baseline:  Goal status: ONGOING  6.  TBD Baseline:  Goal status: ONGOING  ASSESSMENT:  CLINICAL IMPRESSION: Patient is a 74 y.o. male who was seen today for aphasia and cognitive-communication evaluation s/p multiple strokes. Pt presents with mild aphasia characterized by increased anomia and reduced language content according pt and pt's spouse. 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 Pt notes that communication is more labored and that he does not use as many words as he use to during conversations. Pt's reading skills have decreased compared to prior to the strokes according to pt. Pt states that he could use to read multiple paragraphs without  thinking about it; however, pt now notices that he requires multiple re-readings to comprehend texts. Pt reads for fun. In addition to aphasia, pt presents with cognitive-communication deficits characterized by short-term memory impairment. Pt's spouse notes that pt's short-term memory recall is reduced compared ot prior to strokes. Pt also endorses this fact. CLQT is currently in progress and will provide more information about pt's cognitive-communication skills.   ST services are recommended due to significant impacts of current deficits on pt's communication at home and in public. According to PROM, pt rated every prompt with a score of 1, which notes quite a bit of effort with using his language in multiple communication contexts. Additionally, pt needs ST services to optimize safety at home due to pt-reported short-term memory deficits. Without skilled services, pt is at risk of reduced safety and social engagement in multiple communication contexts, which all significantly impact QOL.  OBJECTIVE IMPAIRMENTS: include memory, expressive language, and aphasia. These impairments are limiting patient from ADLs/IADLs and effectively communicating at home and in community. Factors affecting potential to achieve goals and functional outcome are N/A. Patient will benefit from skilled SLP services to address above impairments and improve overall function.  REHAB POTENTIAL: Excellent  PLAN:  SLP FREQUENCY: 2x/week  SLP DURATION: 8 weeks  PLANNED INTERVENTIONS: Language facilitation, Environmental controls, Cognitive reorganization, Internal/external aids, Functional tasks, SLP instruction and feedback, Compensatory strategies, Patient/family education, and [id number] Treatment of speech (30 or 45 min)     Waddell Music, M.A., CF-SLP 11/20/2024, 10:07 AM      "

## 2024-11-20 NOTE — Patient Instructions (Signed)
 Memory Strategies:  Dump basket in every room where things need to be put.  Use sticky notes or other written forms of visual cues to help with remembering information (including to speak to the 74 year old)

## 2024-11-23 ENCOUNTER — Ambulatory Visit

## 2024-11-27 ENCOUNTER — Ambulatory Visit

## 2024-11-30 ENCOUNTER — Ambulatory Visit

## 2024-12-04 ENCOUNTER — Encounter: Payer: Self-pay | Admitting: Cardiovascular Disease

## 2024-12-04 ENCOUNTER — Ambulatory Visit

## 2024-12-04 NOTE — Telephone Encounter (Signed)
 Call x1; lvmtcb + MyChart message sent to patient to schedule w/ [facility] for loop implant.

## 2024-12-07 ENCOUNTER — Ambulatory Visit

## 2024-12-10 ENCOUNTER — Ambulatory Visit: Admitting: Cardiovascular Disease

## 2024-12-11 ENCOUNTER — Ambulatory Visit

## 2024-12-14 ENCOUNTER — Ambulatory Visit

## 2024-12-18 ENCOUNTER — Ambulatory Visit

## 2024-12-21 ENCOUNTER — Ambulatory Visit

## 2024-12-25 ENCOUNTER — Ambulatory Visit

## 2024-12-28 ENCOUNTER — Ambulatory Visit

## 2025-04-24 ENCOUNTER — Ambulatory Visit: Admitting: Neurology

## 2025-06-25 ENCOUNTER — Ambulatory Visit: Admitting: Neurology
